# Patient Record
Sex: Female | Born: 2000 | Race: Black or African American | Hispanic: No | Smoking: Current some day smoker
Health system: Southern US, Community
[De-identification: ages and names within clinical notes are randomized; demographics above are authoritative.]

## PROBLEM LIST (undated history)

## (undated) HISTORY — PX: OTHER SURGICAL HISTORY: SHX169

---
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
## 2020-07-18 IMAGING — CR CHEST - 2 VIEW
1 series · 2 of 2 positions shown · non-contrast
Comparison: None.

CLINICAL DATA: Chest pain.

EXAM:
CHEST - 2 VIEW

[Series 1: dg chest 2 view · 0.14mm/px · 2 of 2 slices shown]
[im 1/2]
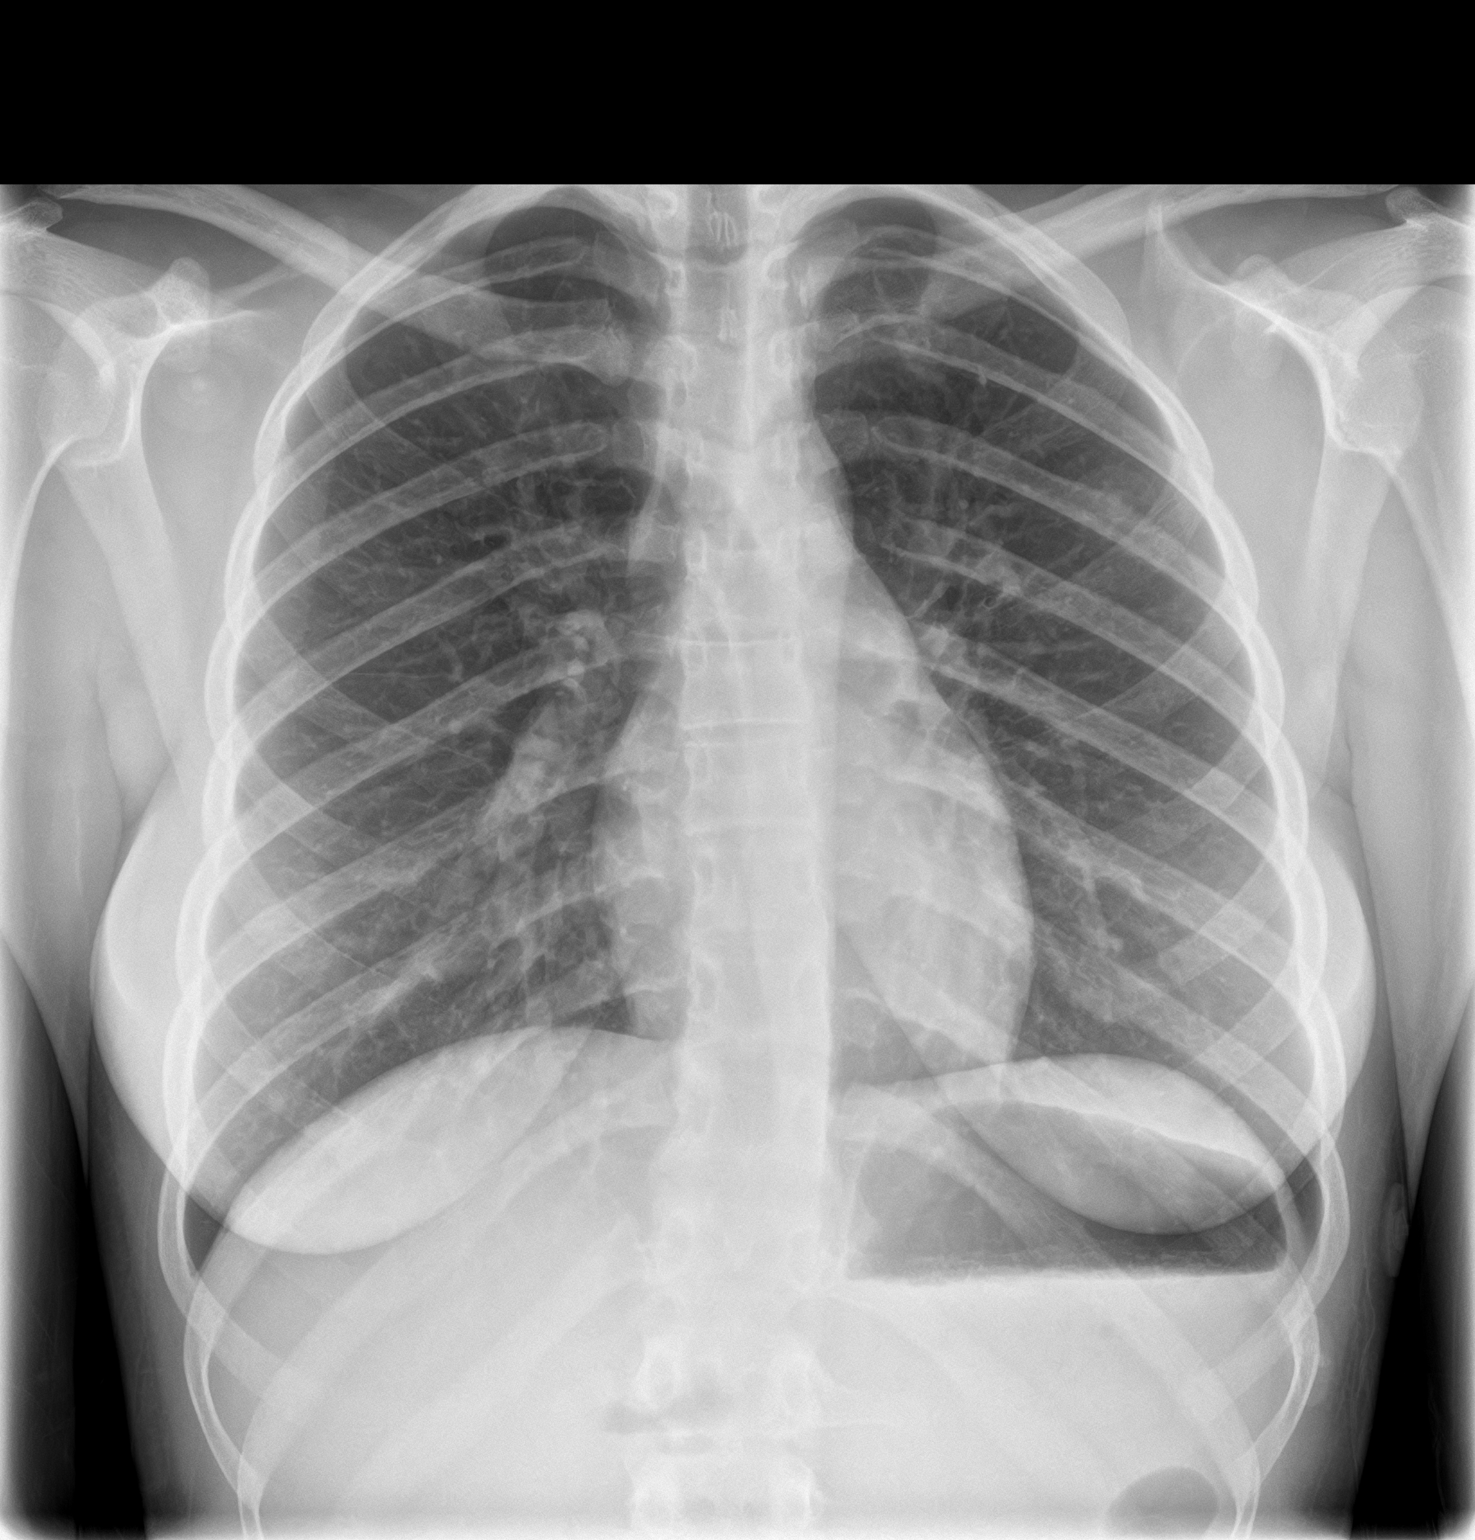
[im 2/2]
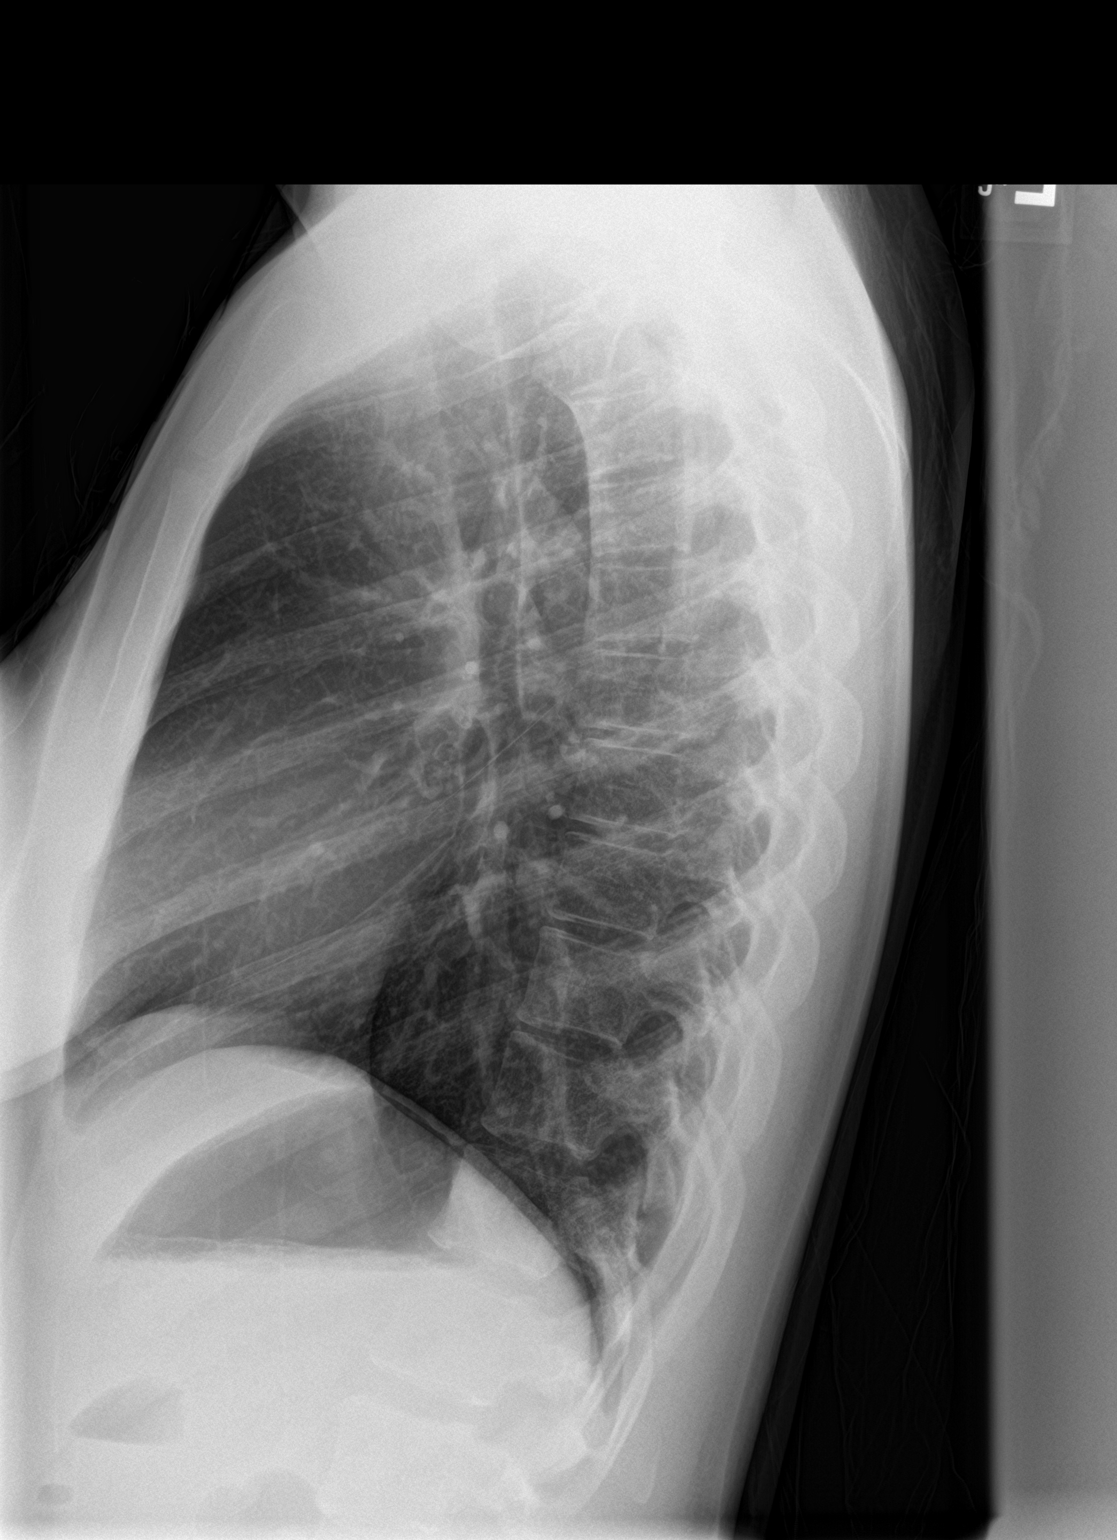

[2 of 2 positions shown; findings below may reference images not displayed]

FINDINGS: The cardiomediastinal contours are normal. The lungs are clear.
Pulmonary vasculature is normal. No consolidation, pleural effusion,
or pneumothorax. No acute osseous abnormalities are seen.
IMPRESSION: Normal radiographs of the chest.

## 2021-04-12 ENCOUNTER — Ambulatory Visit: Payer: Medicaid Other

## 2021-08-28 ENCOUNTER — Ambulatory Visit (LOCAL_COMMUNITY_HEALTH_CENTER): Payer: Medicaid Other

## 2021-08-28 VITALS — BP 112/72 | Ht 63.0 in | Wt 122.0 lb

## 2021-08-28 DIAGNOSIS — Z3201 Encounter for pregnancy test, result positive: Secondary | ICD-10-CM

## 2021-08-28 MED ORDER — PRENATAL 27-0.8 MG PO TABS: 1.0000 | ORAL_TABLET | Freq: Every day | ORAL | 0 refills | Status: AC

## 2021-08-28 NOTE — Progress Notes (Signed)
UPT positive.  Not sure where she wants to go for prenatal care.  Prenatal care resource list given and instructed to establish care as soon as possible.  Pregnancy information packet given and reviewed with pt.    The patient was dispensed prenatal vitamins today. I provided counseling today regarding the medication. We discussed the medication, the side effects and when to call clinic. Patient given the opportunity to ask questions. Questions answered.    Cherlynn Polo, RN

## 2021-08-29 LAB — PREGNANCY, URINE: Preg Test, Ur: POSITIVE — AB

## 2021-09-26 ENCOUNTER — Telehealth: Payer: Self-pay

## 2021-09-26 NOTE — Telephone Encounter (Signed)
Telephone call to patient today to inquire about her plans for prenatal care.  She reports being [redacted] weeks pregnant currently and was going to call [facility] when she was 12 weeks to schedule her first OB appointment.  Dahlia Bailiff, RN

## 2021-10-02 ENCOUNTER — Telehealth: Payer: Self-pay

## 2021-10-02 NOTE — Telephone Encounter (Signed)
Call to client to reschedule new OB appt (EGA = 11 2/7) due to provider availability. Left message to call regarding above and number to call provided. Call to mother (emergency contact) regarding above and she states will contact client. Rich Number, RN

## 2021-10-02 NOTE — Telephone Encounter (Signed)
Call received from client and [facility] IP appt rescheduled for 10/16/2021. Rich Number, RN

## 2021-10-16 ENCOUNTER — Telehealth: Payer: Self-pay

## 2021-10-16 NOTE — Telephone Encounter (Signed)
TC to patient to try to reschedule new OB appointment due to staffing issues. Next available new OB appointment is next Wednesday morning, and 2 appointments available on Thursday 10/26/21. LM with number to call.Jenetta Downer, RN

## 2021-10-17 ENCOUNTER — Ambulatory Visit: Payer: Medicaid Other | Admitting: Advanced Practice Midwife

## 2021-10-17 ENCOUNTER — Telehealth: Payer: Self-pay

## 2021-10-17 ENCOUNTER — Encounter: Payer: Self-pay | Admitting: Advanced Practice Midwife

## 2021-10-17 DIAGNOSIS — Z3401 Encounter for supervision of normal first pregnancy, first trimester: Secondary | ICD-10-CM | POA: Diagnosis not present

## 2021-10-17 DIAGNOSIS — Z3402 Encounter for supervision of normal first pregnancy, second trimester: Secondary | ICD-10-CM | POA: Insufficient documentation

## 2021-10-17 LAB — WET PREP FOR TRICH, YEAST, CLUE
Trichomonas Exam: NEGATIVE
Yeast Exam: NEGATIVE

## 2021-10-17 LAB — HEMOGLOBIN, FINGERSTICK: Hemoglobin: 12.3 g/dL (ref 11.1–15.9)

## 2021-10-17 NOTE — Progress Notes (Signed)
Wet mount reviewed with provider - no treatment indicated due to no symptoms.   Hgb reviewed - no treatment indicated.   Al Decant, RN

## 2021-10-17 NOTE — Progress Notes (Signed)
Patient here for new OB visit at about 13 3/7. FOB in waiting area, [place], would like to come in for heart beat. Patient lives with her mom and her brother.Jenetta Downer, RN

## 2021-10-17 NOTE — Telephone Encounter (Signed)
Call to patient to let her know of [place] appointment on 10/19/21 at 2:30pm. Gave patient address to [facility]. Patient aware to arrive with a full bladder.   Al Decant, RN

## 2021-10-17 NOTE — Progress Notes (Signed)
[place] [facility]  [facility]   INITIAL PRENATAL VISIT NOTE  Subjective:  Briana Robinson is a 21 y.o. SBF exvaper G1P0000 at [redacted]w[redacted]d being seen today to start prenatal care at the [facility] Department. She feels "a little nervous but mostly excited" about surprise pregnancy with no birth control. 21 yo employed FOB feels "same as me" about pregnancy, has no children; in supportive 1 year relationship. She is employed 35-40 hrs/wk at [employer], not in school and living with her mom and 57 yo brother. LMP 07/15/21. Denies u/s or ER use this pregnancy.  Denies cigs, cigars, MJ. Last vaped 05/2021. Last ETOH 05/22/21 (1 Margarita) first time. Last dental exam age 77.  She is currently monitored for the following issues for this low-risk pregnancy and has Encounter for supervision of normal first pregnancy, first trimester on their problem list.  Patient reports no complaints.   .  .   . Denies leaking of fluid.   Indications for ASA therapy (per uptodate) One of the following: Previous pregnancy with preeclampsia, especially early onset and with an adverse outcome No Multifetal gestation No Chronic hypertension No Type 1 or 2 diabetes mellitus No Chronic kidney disease No Autoimmune disease (antiphospholipid syndrome, systemic lupus erythematosus) No  Two or more of the following: Nulliparity Yes Obesity (body mass index >30 kg/m2) No Family history of preeclampsia in mother or sister No Age ?35 years No Sociodemographic characteristics (African American race, low socioeconomic level) Yes Personal risk factors (eg, previous pregnancy with low birth weight or small for gestational age infant, previous adverse pregnancy outcome [eg, stillbirth], interval >10 years between pregnancies) No   The following portions of the patient's history were reviewed and updated as appropriate: allergies, current medications, past family history, past medical history, past social  history, past surgical history and problem list. Problem list updated.  Objective:   Vitals:   10/17/21 1351  BP: 106/67  Pulse: 78  Temp: (!) 97.5 F (36.4 C)  Weight: 126 lb (57.2 kg)    Fetal Status:            Physical Exam Vitals and nursing note reviewed.  Constitutional:      General: She is not in acute distress.    Appearance: Normal appearance. She is well-developed and normal weight.  HENT:     Head: Normocephalic and atraumatic.     Right Ear: External ear normal.     Left Ear: External ear normal.     Nose: Nose normal. No congestion or rhinorrhea.     Mouth/Throat:     Lips: Pink.     Mouth: Mucous membranes are moist.     Dentition: Normal dentition. No dental caries.     Pharynx: Oropharynx is clear. Uvula midline.     Comments: Dentition: fair, last dental exam age 48 Eyes:     General: No scleral icterus.    Conjunctiva/sclera: Conjunctivae normal.  Neck:     Thyroid: No thyroid mass, thyromegaly or thyroid tenderness.  Cardiovascular:     Rate and Rhythm: Normal rate.     Pulses: Normal pulses.     Comments: Extremities are warm and well perfused Pulmonary:     Effort: Pulmonary effort is normal.     Breath sounds: Normal breath sounds.  Chest:     Chest wall: No mass.  Breasts:    Tanner Score is 5.     Breasts are symmetrical.     Right: Normal. No mass,  nipple discharge or skin change.     Left: Normal. No mass, nipple discharge or skin change.  Abdominal:     Palpations: Abdomen is soft.     Tenderness: There is no abdominal tenderness.     Comments: Gravid, soft without masses or tenderness, good tone, FH=12 wks size; FHR=160  Genitourinary:    General: Normal vulva.     Exam position: Lithotomy position.     Pubic Area: No rash.      Labia:        Right: No rash.        Left: No rash.      Vagina: Vaginal discharge (white creamy leukorrhea, ph<4.5) present.     Cervix: Normal.     Uterus: Normal. Enlarged (Gravid 12 wks size).  Not tender.      Adnexa: Right adnexa normal and left adnexa normal.     Rectum: Normal. No external hemorrhoid.     Comments: Pap done Musculoskeletal:     Right lower leg: No edema.     Left lower leg: No edema.  Lymphadenopathy:     Cervical: No cervical adenopathy.     Upper Body:     Right upper body: No axillary adenopathy.     Left upper body: No axillary adenopathy.  Skin:    General: Skin is warm.     Capillary Refill: Capillary refill takes less than 2 seconds.  Neurological:     Mental Status: She is alert.    Assessment and Plan:  Pregnancy: G1P0000 at [redacted]w[redacted]d  1. Encounter for supervision of normal first pregnancy, first trimester Counseled on weight gain of 25-25 lbs this pregnancy Desires NIPS today Dating u/s ordered Please give pt dental list and encourage exam  - Pregnancy, Initial Screen - Hgb Fractionation Cascade - MaterniT21 PLUS Core - [id number] Drug Screen - Pap IG (Image Guided) - WET PREP FOR TRICH, YEAST, CLUE - Hemoglobin, venipuncture - US OB Comp Less 14 Wks; Future    Discussed overview of care and coordination with inpatient delivery practices including [facility], [facility], [facility] and [facility].   Reviewed Centering pregnancy as standard of care at [facility]   Preterm labor symptoms and general obstetric precautions including but not limited to vaginal bleeding, contractions, leaking of fluid and fetal movement were reviewed in detail with the patient.  Please refer to After Visit Summary for other counseling recommendations.   Return in about 4 weeks (around 11/14/2021) for routine PNC.  No future appointments.  Alberteen Spindle, CNM

## 2021-10-18 LAB — 789231 7+OXYCODONE-BUND
Amphetamines, Urine: NEGATIVE ng/mL
BENZODIAZ UR QL: NEGATIVE ng/mL
Barbiturate screen, urine: NEGATIVE ng/mL
Cannabinoid Quant, Ur: NEGATIVE ng/mL
Cocaine (Metab.): NEGATIVE ng/mL
OPIATE SCREEN URINE: NEGATIVE ng/mL
Oxycodone/Oxymorphone, Urine: NEGATIVE ng/mL
PCP Quant, Ur: NEGATIVE ng/mL

## 2021-10-19 ENCOUNTER — Ambulatory Visit
Admission: RE | Admit: 2021-10-19 | Discharge: 2021-10-19 | Disposition: A | Discharge status: Home / Self Care | Payer: Medicaid Other | Source: Ambulatory Visit | Attending: Advanced Practice Midwife | Admitting: Advanced Practice Midwife

## 2021-10-19 DIAGNOSIS — Z369 Encounter for antenatal screening, unspecified: Secondary | ICD-10-CM | POA: Insufficient documentation

## 2021-10-19 DIAGNOSIS — Z3A12 12 weeks gestation of pregnancy: Secondary | ICD-10-CM | POA: Insufficient documentation

## 2021-10-19 DIAGNOSIS — Z3401 Encounter for supervision of normal first pregnancy, first trimester: Secondary | ICD-10-CM | POA: Insufficient documentation

## 2021-10-19 LAB — PAP IG (IMAGE GUIDED): PAP Smear Comment: 0

## 2021-10-22 LAB — PREGNANCY, INITIAL SCREEN
Antibody Screen: NEGATIVE
Basophils Absolute: 0 10*3/uL (ref 0.0–0.2)
Basos: 0 %
Bilirubin, UA: NEGATIVE
Chlamydia trachomatis, NAA: NEGATIVE
EOS (ABSOLUTE): 0.2 10*3/uL (ref 0.0–0.4)
Eos: 3 %
Glucose, UA: NEGATIVE
HCV Ab: NONREACTIVE
HIV Screen 4th Generation wRfx: NONREACTIVE
Hematocrit: 38.3 % (ref 34.0–46.6)
Hemoglobin: 12.7 g/dL (ref 11.1–15.9)
Hepatitis B Surface Ag: NEGATIVE
Immature Grans (Abs): 0 10*3/uL (ref 0.0–0.1)
Immature Granulocytes: 0 %
Ketones, UA: NEGATIVE
Lymphocytes Absolute: 2.1 10*3/uL (ref 0.7–3.1)
Lymphs: 29 %
MCH: 27.7 pg (ref 26.6–33.0)
MCHC: 33.2 g/dL (ref 31.5–35.7)
MCV: 84 fL (ref 79–97)
Monocytes Absolute: 0.4 10*3/uL (ref 0.1–0.9)
Monocytes: 5 %
Neisseria Gonorrhoeae by PCR: NEGATIVE
Neutrophils Absolute: 4.5 10*3/uL (ref 1.4–7.0)
Neutrophils: 63 %
Nitrite, UA: POSITIVE — AB
Platelets: 344 10*3/uL (ref 150–450)
Protein,UA: NEGATIVE
RBC, UA: NEGATIVE
RBC: 4.58 x10E6/uL (ref 3.77–5.28)
RDW: 13.3 % (ref 11.7–15.4)
RPR Ser Ql: NONREACTIVE
Rh Factor: POSITIVE
Rubella Antibodies, IGG: 6.46 index (ref >0.99)
Specific Gravity, UA: 1.025 (ref 1.005–1.030)
Urobilinogen, Ur: 1 mg/dL (ref 0.2–1.0)
WBC: 7.3 10*3/uL (ref 3.4–10.8)
pH, UA: 6.5 (ref 5.0–7.5)

## 2021-10-22 LAB — MATERNIT 21 PLUS CORE, BLOOD
Fetal Fraction: 16
Result (T21): NEGATIVE
Trisomy 13 (Patau syndrome): NEGATIVE
Trisomy 18 (Edwards syndrome): NEGATIVE
Trisomy 21 (Down syndrome): NEGATIVE

## 2021-10-22 LAB — MICROSCOPIC EXAMINATION
Casts: NONE SEEN /lpf
Epithelial Cells (non renal): >10 /hpf — AB (ref 0–10)
RBC, Urine: NONE SEEN /hpf (ref 0–2)
WBC, UA: >30 /hpf — AB (ref 0–5)

## 2021-10-22 LAB — URINE CULTURE, OB REFLEX

## 2021-10-22 LAB — HGB FRACTIONATION CASCADE
Hgb A2: 2.7 % (ref 1.8–3.2)
Hgb A: 97.3 % (ref 96.4–98.8)
Hgb F: 0 % (ref 0.0–2.0)
Hgb S: 0 %

## 2021-10-22 LAB — HCV INTERPRETATION

## 2021-10-24 ENCOUNTER — Other Ambulatory Visit: Payer: Self-pay | Admitting: Advanced Practice Midwife

## 2021-10-24 ENCOUNTER — Telehealth: Payer: Self-pay

## 2021-10-24 ENCOUNTER — Encounter: Payer: Self-pay | Admitting: Advanced Practice Midwife

## 2021-10-24 DIAGNOSIS — Z3401 Encounter for supervision of normal first pregnancy, first trimester: Secondary | ICD-10-CM

## 2021-10-24 DIAGNOSIS — N390 Urinary tract infection, site not specified: Secondary | ICD-10-CM | POA: Insufficient documentation

## 2021-10-24 DIAGNOSIS — O2340 Unspecified infection of urinary tract in pregnancy, unspecified trimester: Secondary | ICD-10-CM

## 2021-10-24 MED ORDER — NITROFURANTOIN MONOHYD MACRO 100 MG PO CAPS: 100.0000 mg | ORAL_CAPSULE | Freq: Two times a day (BID) | ORAL | 0 refills | Status: DC

## 2021-10-24 NOTE — Telephone Encounter (Signed)
TC to patient to inform of UTI and need for treatment. Need patient pharmacy so provider can e-prescribe meds. LM with number to call.Jenetta Downer, RN

## 2021-10-24 NOTE — Telephone Encounter (Signed)
Patient called back and patient pharmacy preference added to patient demographics. Patient counseled she needs to pick up medication for UTI. Provider aware pharmacy added and can e-prescribe now, and patient counseled to call [facility] tonight or tomorrow morning before she goes to pick up medication. Jenetta Downer, RN

## 2021-11-09 NOTE — Addendum Note (Signed)
Addended by: Cletis Media on: 11/09/2021 11:53 AM   Modules accepted: Orders

## 2021-11-14 ENCOUNTER — Ambulatory Visit: Payer: Medicaid Other | Admitting: Advanced Practice Midwife

## 2021-11-14 ENCOUNTER — Ambulatory Visit: Payer: Medicaid Other

## 2021-11-14 VITALS — BP 101/61 | HR 86 | Temp 98.7°F | Wt 126.0 lb

## 2021-11-14 DIAGNOSIS — O2342 Unspecified infection of urinary tract in pregnancy, second trimester: Secondary | ICD-10-CM

## 2021-11-14 DIAGNOSIS — Z3401 Encounter for supervision of normal first pregnancy, first trimester: Secondary | ICD-10-CM

## 2021-11-14 DIAGNOSIS — O2340 Unspecified infection of urinary tract in pregnancy, unspecified trimester: Secondary | ICD-10-CM

## 2021-11-14 DIAGNOSIS — Z3402 Encounter for supervision of normal first pregnancy, second trimester: Secondary | ICD-10-CM

## 2021-11-14 NOTE — Addendum Note (Signed)
Addended by: Donnal Moat on: 11/14/2021 02:48 PM   Modules accepted: Orders

## 2021-11-14 NOTE — Progress Notes (Signed)
Per client, received Nitrofurantoin in the mail from [place] and has 2 pills left to take. States not completed because slept all day x1 and did not take medicine. AFP only today. Rich Number, RN [place] referral for anatomy scan faxed to [facility] with snapshot pages. Fax confirmation received. Rich Number, RN

## 2021-11-14 NOTE — Progress Notes (Signed)
[facility]  PRENATAL VISIT NOTE  Subjective:  Briana Robinson is a 21 y.o. G1P0000 at [redacted]w[redacted]d being seen today for ongoing prenatal care.  She is currently monitored for the following issues for this low-risk pregnancy and has Encounter for supervision of normal first pregnancy, first trimester and UTI (urinary tract infection) during pregnancy 10/17/21 >100,000 Klebsiella pneumoniae on their problem list.  Patient reports no complaints.   .  .  Movement: Absent. Denies leaking of fluid/ROM.   The following portions of the patient's history were reviewed and updated as appropriate: allergies, current medications, past family history, past medical history, past social history, past surgical history and problem list. Problem list updated.  Objective:   Vitals:   11/14/21 1044  BP: 101/61  Pulse: 86  Temp: 98.7 F (37.1 C)  Weight: 126 lb (57.2 kg)    Fetal Status:     Movement: Absent     General:  Alert, oriented and cooperative. Patient is in no acute distress.  Skin: Skin is warm and dry. No rash noted.   Cardiovascular: Normal heart rate noted  Respiratory: Normal respiratory effort, no problems with respiration noted  Abdomen: Soft, gravid, appropriate for gestational age.        Pelvic: Cervical exam deferred        Extremities: Normal range of motion.  Edema: None  Mental Status: Normal mood and affect. Normal behavior. Normal judgment and thought content.   Assessment and Plan:  Pregnancy: G1P0000 at [redacted]w[redacted]d  1. Encounter for supervision of normal first pregnancy in second trimester Here with FOB AFP only today Working 35 hrs/wk Anatomy u/s ordered Walking 3x/wk x 1 hour Living with her mom Reviewed 10/19/21 u/s at 12 2/7  - AFP, Serum, Open Spina Bifida   3. Urinary tract infection in mother during pregnancy, antepartum dx'd 10/17/21 Pt states she received macrobid in the mail from [place] on 10/30/21 and began taking on  10/31/21 but was noncompliant and forgot to take at least 1 day C&S TOC today  - Urine Culture & Sensitivity   Preterm labor symptoms and general obstetric precautions including but not limited to vaginal bleeding, contractions, leaking of fluid and fetal movement were reviewed in detail with the patient. Please refer to After Visit Summary for other counseling recommendations.  Return in about 4 weeks (around 12/12/2021) for routine PNC.  No future appointments.  Herbie Saxon, CNM

## 2021-11-16 ENCOUNTER — Telehealth: Payer: Self-pay

## 2021-11-16 LAB — AFP, SERUM, OPEN SPINA BIFIDA
AFP MoM: 2.4
AFP Value: 100.5 ng/mL
Gest. Age on Collection Date: 16 weeks
Maternal Age At EDD: 21.9 yr
OSBR Risk 1 IN: 698
Test Results:: NEGATIVE
Weight: 126 [lb_av]

## 2021-11-16 NOTE — Telephone Encounter (Signed)
Call to [facility] to ascertain if anatomy US (referral faxed 11/14/21) has been scheduled. Appt is still pending per receptionist. Jossie Ng, RN

## 2021-11-17 LAB — URINE CULTURE

## 2021-11-20 ENCOUNTER — Other Ambulatory Visit: Payer: Self-pay | Admitting: Advanced Practice Midwife

## 2021-11-20 ENCOUNTER — Telehealth: Payer: Self-pay

## 2021-11-20 ENCOUNTER — Encounter: Payer: Self-pay | Admitting: Advanced Practice Midwife

## 2021-11-20 DIAGNOSIS — O2340 Unspecified infection of urinary tract in pregnancy, unspecified trimester: Secondary | ICD-10-CM

## 2021-11-20 MED ORDER — AMOXICILLIN-POT CLAVULANATE 875-125 MG PO TABS: 1.0000 | ORAL_TABLET | Freq: Two times a day (BID) | ORAL | 0 refills | Status: DC

## 2021-11-20 NOTE — Telephone Encounter (Signed)
Client has UTI (TOC positive) and needs treatment. Prescription has been e-prescribed by Hazle Coca CNM and client may either pick up prescription or come to [facility] for Ceftriaxone if does not want to take oral medication.  Call to client and per recorded message, not accepting calls at this time. Call to mother (emergency contact) and requested assistance contacting client to call [facility] regarding above. Number to call provided and mother states will try to contact her. Jossie Ng, RN

## 2021-11-20 NOTE — Telephone Encounter (Signed)
Return call from client and transferred to [facility] by Denzil Hughes, [facility] intake clerk. Per client, she would prefer to come to clinic for injection and appt scheduled for tomorrow with arrival time of 1600 (time does not interfere with her job).  Hazle Coca CNM notified of client's decision regarding treatment. Jossie Ng, RN

## 2021-11-21 ENCOUNTER — Ambulatory Visit: Payer: Medicaid Other | Admitting: Advanced Practice Midwife

## 2021-11-21 VITALS — BP 124/75 | HR 89 | Temp 97.4°F | Wt 128.4 lb

## 2021-11-21 DIAGNOSIS — O2340 Unspecified infection of urinary tract in pregnancy, unspecified trimester: Secondary | ICD-10-CM

## 2021-11-21 DIAGNOSIS — Z3402 Encounter for supervision of normal first pregnancy, second trimester: Secondary | ICD-10-CM

## 2021-11-21 DIAGNOSIS — Z3401 Encounter for supervision of normal first pregnancy, first trimester: Secondary | ICD-10-CM

## 2021-11-21 DIAGNOSIS — O2342 Unspecified infection of urinary tract in pregnancy, second trimester: Secondary | ICD-10-CM

## 2021-11-21 MED ORDER — CEFTRIAXONE SODIUM 1 G IJ SOLR: 1.0000 g | Freq: Once | INTRAMUSCULAR | 0 refills | Status: DC

## 2021-11-21 MED ORDER — CEFTRIAXONE SODIUM 500 MG IJ SOLR
1000.0000 mg | Freq: Once | INTRAMUSCULAR | Status: AC
  Administered 2021-11-21: 1000 mg via INTRAMUSCULAR

## 2021-11-21 NOTE — Progress Notes (Signed)
[facility]  PRENATAL VISIT NOTE  Subjective:  Briana Robinson is a 21 y.o. G1P0000 at [redacted]w[redacted]d being seen today for ongoing prenatal care.  She is currently monitored for the following issues for this low-risk pregnancy and has Encounter for supervision of normal first pregnancy, first trimester; UTI (urinary tract infection); and Urinary tract infection #2 11/14/21 Klebsiella pneumoniae on their problem list.  Patient reports no complaints.   .  .   . Denies leaking of fluid/ROM.   The following portions of the patient's history were reviewed and updated as appropriate: allergies, current medications, past family history, past medical history, past social history, past surgical history and problem list. Problem list updated.  Objective:   Vitals:   11/21/21 1538  BP: 124/75  Pulse: 89  Temp: (!) 97.4 F (36.3 C)  Weight: 128 lb 6.4 oz (58.2 kg)    Fetal Status:           General:  Alert, oriented and cooperative. Patient is in no acute distress.  Skin: Skin is warm and dry. No rash noted.   Cardiovascular: Normal heart rate noted  Respiratory: Normal respiratory effort, no problems with respiration noted  Abdomen: Soft, gravid, appropriate for gestational age.        Pelvic: Cervical exam deferred        Extremities: Normal range of motion.     Mental Status: Normal mood and affect. Normal behavior. Normal judgment and thought content.   Assessment and Plan:  Pregnancy: G1P0000 at [redacted]w[redacted]d  1. Urinary tract infection #2 11/14/21 Klebsiella pneumoniae Did not clear first UTI because was noncompliant with tx Drinking coffee 1x/wk and coke 12 oz 1x/wk--counseled to stop Pt chooses to have Ceftriaxone IM 1 gm today instead of Augmentin po BID x 10 days Will need TOC after tx completed -CVAT   2. Encounter for supervision of normal first pregnancy, first trimester Next apt here 12/12/21 Here with her mom    Preterm labor symptoms and  general obstetric precautions including but not limited to vaginal bleeding, contractions, leaking of fluid and fetal movement were reviewed in detail with the patient. Please refer to After Visit Summary for other counseling recommendations.  Return in about 3 weeks (around 12/12/2021) for routine PNC.  Future Appointments  Date Time Provider Department Center  12/12/2021  4:00 PM [facility] PROVIDER [facility] None    Alberteen Spindle, CNM

## 2021-11-21 NOTE — Progress Notes (Signed)
Macrobid missed doses for several days. Did not take Augmentin. Here for UTI IM Injection. Tolerated IM injection. BThiele RN

## 2021-11-22 ENCOUNTER — Telehealth: Payer: Self-pay

## 2021-11-22 NOTE — Telephone Encounter (Signed)
Call to [facility] to ascertain if anatomy US appt scheduled. Per Trula Ore, they have not been able to contact her via phone call. Another phone number provided for client and Trula Ore states they will use that number and call client again. Jossie Ng, RN

## 2021-11-23 ENCOUNTER — Telehealth: Payer: Self-pay

## 2021-11-23 NOTE — Telephone Encounter (Signed)
Call to client and requested she contact [facility] (number provided) to obtain appt for anatomy US. Referral faxed on 11/14/2021 and again on 11/21/2021 with fax confirmation received each time. Jossie Ng, RN

## 2021-11-27 ENCOUNTER — Telehealth: Payer: Self-pay

## 2021-11-27 NOTE — Telephone Encounter (Signed)
Call to [facility] to ascertain if anatomy US appt scheduled. Per Lawanna Kobus, a voicemail was left on client's phone 11/22/2021 requesting she call them to schedule [place] appt. Thus far, [facility] has not had a response from client. This RN called client at home phone number and per recorded message, not a working number. Call to client's cell phone and left message requesting she return [facility]'s call from 11/22/2021 and schedule [place] appt. [facility]. [facility] phone number left on message. Jossie Ng, RN

## 2021-11-29 ENCOUNTER — Telehealth: Payer: Self-pay

## 2021-11-29 NOTE — Telephone Encounter (Signed)
TC to patient home number to give her phone number and counsel to call [facility] to schedule anatomy U/S. [facility] has been unsuccessful in attempts to reach patient. Unable to LM on home phone number. TC to cell number and left message with [facility] number to call for U/S appointment. Burt Knack, RN

## 2021-12-04 ENCOUNTER — Telehealth: Payer: Self-pay | Admitting: Family Medicine

## 2021-12-04 NOTE — Telephone Encounter (Signed)
Returned call to patient who wants to know about her U/S appointment. [facility] has not been able to reach patient, so patient given phone number for [facility] and counseled to call for appointment tomorrow between 8am -5pm. Patient states understanding.Burt Knack, RN

## 2021-12-05 NOTE — Telephone Encounter (Signed)
Per [facility], client called this am and anatomy US appt scheduled for 12/21/2021 at 3:45 pm. Jossie Ng, RN

## 2021-12-05 NOTE — Telephone Encounter (Signed)
Call to [facility] and client has not yet scheduled anatomy US appt. Call to client who states someone called her yesterday and gave her the number to call for [place]. Client states she plans to call today to schedule appt. Jossie Ng, RN

## 2021-12-12 ENCOUNTER — Ambulatory Visit: Payer: Medicaid Other | Admitting: Family Medicine

## 2021-12-12 VITALS — BP 108/66 | HR 75 | Temp 97.5°F | Wt 133.6 lb

## 2021-12-12 DIAGNOSIS — O2342 Unspecified infection of urinary tract in pregnancy, second trimester: Secondary | ICD-10-CM

## 2021-12-12 DIAGNOSIS — Z3402 Encounter for supervision of normal first pregnancy, second trimester: Secondary | ICD-10-CM

## 2021-12-12 NOTE — Progress Notes (Signed)
[facility]  PRENATAL VISIT NOTE  Subjective:  Briana Robinson is a 21 y.o. G1P0000 at [redacted]w[redacted]d being seen today for ongoing prenatal care.  She is currently monitored for the following issues for this low-risk pregnancy and has Encounter for supervision of normal first pregnancy in second trimester; UTI (urinary tract infection); and Urinary tract infection #2 11/14/21 Klebsiella pneumoniae on their problem list.  Patient reports no complaints.  Contractions: Not present. Vag. Bleeding: None.  Movement: Present. Denies leaking of fluid/ROM.   The following portions of the patient's history were reviewed and updated as appropriate: allergies, current medications, past family history, past medical history, past social history, past surgical history and problem list. Problem list updated.  Objective:   Vitals:   12/12/21 1554  BP: 108/66  Pulse: 75  Temp: (!) 97.5 F (36.4 C)  Weight: 133 lb 9.6 oz (60.6 kg)    Fetal Status: Fetal Heart Rate (bpm): 169 Fundal Height: 20 cm Movement: Present  Presentation: Undeterminable  General:  Alert, oriented and cooperative. Patient is in no acute distress.  Skin: Skin is warm and dry. No rash noted.   Cardiovascular: Normal heart rate noted  Respiratory: Normal respiratory effort, no problems with respiration noted  Abdomen: Soft, gravid, appropriate for gestational age.  Pain/Pressure: Absent     Pelvic: Cervical exam deferred        Extremities: Normal range of motion.  Edema: None  Mental Status: Normal mood and affect. Normal behavior. Normal judgment and thought content.   Assessment and Plan:  Pregnancy: G1P0000 at [redacted]w[redacted]d  1. Encounter for supervision of normal first pregnancy in second trimester -Had previous early U/S (<14wks) 10/12 -Anatomy U/S scheduled 12/14 -Discussed with patient breast changes- having small amounts of discharge from breast- likely early milk production- counseled on  normality   2. Urinary tract infection in mother during second trimester of pregnancy Second UTI this pregnancy- tx with Ceftriaxone- TOC today (12/5)  - Urine Culture & Sensitivity'   Preterm labor symptoms and general obstetric precautions including but not limited to vaginal bleeding, contractions, leaking of fluid and fetal movement were reviewed in detail with the patient.  Please refer to After Visit Summary for other counseling recommendations.   F/u in 4 weeks for routine prenatal care.   Lenice Llamas, [place]

## 2021-12-14 LAB — URINE CULTURE

## 2021-12-21 ENCOUNTER — Telehealth: Payer: Self-pay | Admitting: Family Medicine

## 2021-12-21 NOTE — Telephone Encounter (Signed)
Pt states that Lanora Manis told her at her last appointment that she was scheduling an ultrasound appointment for her for 12/21/2021. [facility] and states that they do not have her scheduled. She needs to know if the appointment was definitely scheduled and where it was scheduled

## 2021-12-21 NOTE — Telephone Encounter (Signed)
Call to client who states realized [place] appt was scheduled at [facility] and she states she is at [facility] now. Jossie Ng, RN

## 2021-12-25 ENCOUNTER — Encounter: Payer: Self-pay | Admitting: Advanced Practice Midwife

## 2022-01-08 NOTE — L&D Delivery Note (Signed)
     Delivery Note   Briana Robinson is a 22 y.o. G1P0000 at [redacted]w[redacted]d Estimated Date of Delivery: 05/01/22  PRE-OPERATIVE DIAGNOSIS:  1) [redacted]w[redacted]d pregnancy.    POST-OPERATIVE DIAGNOSIS:  1) [redacted]w[redacted]d pregnancy s/p Vaginal, Spontaneous    Delivery Type: Vaginal, Spontaneous    Delivery Anesthesia: Epidural   Labor Complications: variable and late decelerations, meconium stained amniotic fluid.     ESTIMATED BLOOD LOSS: 100 ml    FINDINGS:   1) female infant, Apgar scores of 8   at 1 minute and 8   at 5 minutes and a birthweight pending , infant skin to skin.  2) Nuchal cord: no  SPECIMENS:   PLACENTA:   Appearance: Intact , 3 vessel cord, cord blood collected   Removal: Spontaneous      Disposition:  intact , per protocol   DISPOSITION:  Infant to left in stable condition in the delivery room, with L&D personnel and mother,  NARRATIVE SUMMARY: Labor course:  Ms. Briana Robinson is a G1P0000 at [redacted]w[redacted]d who presented for labor management.  She progressed well in labor with pitocin.  She received the appropriate anesthesia and proceeded to complete dilation. She evidenced good maternal expulsive effort during the second stage. She went on to deliver a viable female infant "Giana". The placenta delivered without problems and was noted to be complete. A perineal and vaginal examination was performed. Episiotomy/Lacerations: None . The patient tolerated this well.  Doreene Burke, CNM  05/05/2022 2:54 PM

## 2022-01-09 ENCOUNTER — Ambulatory Visit: Payer: Medicaid Other | Admitting: Advanced Practice Midwife

## 2022-01-09 VITALS — BP 110/65 | HR 83 | Temp 97.5°F | Wt 134.8 lb

## 2022-01-09 DIAGNOSIS — O2340 Unspecified infection of urinary tract in pregnancy, unspecified trimester: Secondary | ICD-10-CM

## 2022-01-09 DIAGNOSIS — O2342 Unspecified infection of urinary tract in pregnancy, second trimester: Secondary | ICD-10-CM

## 2022-01-09 DIAGNOSIS — Z3402 Encounter for supervision of normal first pregnancy, second trimester: Secondary | ICD-10-CM

## 2022-01-09 NOTE — Progress Notes (Signed)
[facility]  PRENATAL VISIT NOTE  Subjective:  Briana Robinson is a 22 y.o. G1P0000 at [redacted]w[redacted]d being seen today for ongoing prenatal care.  She is currently monitored for the following issues for this low-risk pregnancy and has Encounter for supervision of normal first pregnancy in second trimester; UTI (urinary tract infection); and Urinary tract infection #2 11/14/21 Klebsiella pneumoniae on their problem list.  Patient reports no complaints.  Contractions: Not present. Vag. Bleeding: None.  Movement: Present. Denies leaking of fluid/ROM.   The following portions of the patient's history were reviewed and updated as appropriate: allergies, current medications, past family history, past medical history, past social history, past surgical history and problem list. Problem list updated.  Objective:   Vitals:   01/09/22 1606  BP: 110/65  Pulse: 83  Temp: (!) 97.5 F (36.4 C)  Weight: 134 lb 12.8 oz (61.1 kg)    Fetal Status: Fetal Heart Rate (bpm): 140 Fundal Height: 25 cm Movement: Present     General:  Alert, oriented and cooperative. Patient is in no acute distress.  Skin: Skin is warm and dry. No rash noted.   Cardiovascular: Normal heart rate noted  Respiratory: Normal respiratory effort, no problems with respiration noted  Abdomen: Soft, gravid, appropriate for gestational age.  Pain/Pressure: Absent     Pelvic: Cervical exam deferred        Extremities: Normal range of motion.  Edema: None  Mental Status: Normal mood and affect. Normal behavior. Normal judgment and thought content.   Assessment and Plan:  Pregnancy: G1P0000 at [redacted]w[redacted]d  1. Encounter for supervision of normal first pregnancy in second trimester Here with FOB -3.2 oz (-0.091 kg) Gained 1 lb in 4 wks--counseled to eat high protein snacks q 2 hours Hasn't made dental exam yet--given dental list and encouraged Working 25-30 hrs/wk Walking 2x/wk x 30 min Reviewed 12/21/21  u/s at 21 3/7 with AFI wnl, anatomy wnl, no previa, fundal placenta  2. Urinary tract infection #2 11/14/21 Klebsiella pneumoniae C&S TOC on 12/12/21=neg after Ceftriaxone IM on 11/21/21   Preterm labor symptoms and general obstetric precautions including but not limited to vaginal bleeding, contractions, leaking of fluid and fetal movement were reviewed in detail with the patient. Please refer to After Visit Summary for other counseling recommendations.  No follow-ups on file.  No future appointments.  Herbie Saxon, CNM

## 2022-01-09 NOTE — Progress Notes (Signed)
Pregnancy Care Home forms completed and in outguide for provider review. Kept 12/21/2021 [facility] [place] appt. Rich Number, RN

## 2022-02-06 ENCOUNTER — Ambulatory Visit: Payer: Medicaid Other | Admitting: Advanced Practice Midwife

## 2022-02-06 VITALS — BP 115/70 | HR 93 | Temp 96.8°F | Wt 138.4 lb

## 2022-02-06 DIAGNOSIS — Z3402 Encounter for supervision of normal first pregnancy, second trimester: Secondary | ICD-10-CM

## 2022-02-06 DIAGNOSIS — Z3403 Encounter for supervision of normal first pregnancy, third trimester: Secondary | ICD-10-CM

## 2022-02-06 LAB — HEMOGLOBIN, FINGERSTICK: Hemoglobin: 11.3 g/dL (ref 11.1–15.9)

## 2022-02-06 NOTE — Progress Notes (Signed)
[facility]  PRENATAL VISIT NOTE  Subjective:  Briana Robinson is a 22 y.o. G1P0000 at [redacted]w[redacted]d being seen today for ongoing prenatal care.  She is currently monitored for the following issues for this low-risk pregnancy and has Encounter for supervision of normal first pregnancy in second trimester; UTI (urinary tract infection) dx'd 10/17/21; and Urinary tract infection #2 11/14/21 Klebsiella pneumoniae on their problem list.  Patient reports no complaints.  Contractions: Not present. Vag. Bleeding: None.  Movement: Present. Denies leaking of fluid/ROM.   The following portions of the patient's history were reviewed and updated as appropriate: allergies, current medications, past family history, past medical history, past social history, past surgical history and problem list. Problem list updated.  Objective:   Vitals:   02/06/22 1507  BP: 115/70  Pulse: 93  Temp: (!) 96.8 F (36 C)  Weight: 138 lb 6.4 oz (62.8 kg)    Fetal Status: Fetal Heart Rate (bpm): 145 Fundal Height: 27 cm Movement: Present     General:  Alert, oriented and cooperative. Patient is in no acute distress.  Skin: Skin is warm and dry. No rash noted.   Cardiovascular: Normal heart rate noted  Respiratory: Normal respiratory effort, no problems with respiration noted  Abdomen: Soft, gravid, appropriate for gestational age.  Pain/Pressure: Absent     Pelvic: Cervical exam deferred        Extremities: Normal range of motion.  Edema: None  Mental Status: Normal mood and affect. Normal behavior. Normal judgment and thought content.   Assessment and Plan:  Pregnancy: G1P0000 at [redacted]w[redacted]d  1. Encounter for supervision of normal first pregnancy in second trimester Here with mom Working 18-20 hrs/wk Walking on treadmill 3x/wk x 30-45 min Feels well 3 lb 6.4 oz (1.542 kg)  - Hemoglobin, venipuncture - HIV-1/HIV-2 Qualitative RNA - RPR - Glucose, 1 hour  gestational   Preterm labor symptoms and general obstetric precautions including but not limited to vaginal bleeding, contractions, leaking of fluid and fetal movement were reviewed in detail with the patient. Please refer to After Visit Summary for other counseling recommendations.  Return in about 2 weeks (around 02/20/2022) for routine PNC.  No future appointments.  Herbie Saxon, CNM

## 2022-02-06 NOTE — Progress Notes (Signed)
28 week labs done today, Pt wants to do Tdap next visit.  Hgb: 11.3, no treatment indicated.

## 2022-02-08 LAB — GLUCOSE, 1 HOUR GESTATIONAL: Gestational Diabetes Screen: 123 mg/dL (ref 70–139)

## 2022-02-08 LAB — HIV-1/HIV-2 QUALITATIVE RNA
HIV-1 RNA, Qualitative: NONREACTIVE
HIV-2 RNA, Qualitative: NONREACTIVE

## 2022-02-08 LAB — RPR: RPR Ser Ql: NONREACTIVE

## 2022-02-20 ENCOUNTER — Ambulatory Visit: Payer: Medicaid Other | Admitting: Advanced Practice Midwife

## 2022-02-20 VITALS — BP 114/69 | HR 90 | Temp 97.6°F | Wt 141.2 lb

## 2022-02-20 DIAGNOSIS — Z3402 Encounter for supervision of normal first pregnancy, second trimester: Secondary | ICD-10-CM

## 2022-02-20 NOTE — Progress Notes (Signed)
[facility]  PRENATAL VISIT NOTE  Subjective:  Briana Robinson is a 22 y.o. G1P0000 at 48w0dbeing seen today for ongoing prenatal care.  She is currently monitored for the following issues for this low-risk pregnancy and has Encounter for supervision of normal first pregnancy in second trimester; UTI (urinary tract infection) dx'd 10/17/21; and Urinary tract infection #2 11/14/21 Klebsiella pneumoniae on their problem list.  Patient reports no complaints.  Contractions: Not present. Vag. Bleeding: None.  Movement: Present. Denies leaking of fluid/ROM.  The following portions of the patient's history were reviewed and updated as appropriate: allergies, current medications, past family history, past medical history, past social history, past surgical history and problem list. Problem list updated.  Objective:   Vitals:   02/20/22 1410  BP: 114/69  Pulse: 90  Temp: 97.6 F (36.4 C)  Weight: 141 lb 3.2 oz (64 kg)    Fetal Status: Fetal Heart Rate (bpm): 148 Fundal Height: 30 cm Movement: Present     General:  Alert, oriented and cooperative. Patient is in no acute distress.  Skin: Skin is warm and dry. No rash noted.   Cardiovascular: Normal heart rate noted  Respiratory: Normal respiratory effort, no problems with respiration noted  Abdomen: Soft, gravid, appropriate for gestational age.  Pain/Pressure: Absent     Pelvic: Cervical exam deferred        Extremities: Normal range of motion.  Edema: None  Mental Status: Normal mood and affect. Normal behavior. Normal judgment and thought content.   Assessment and Plan:  Pregnancy: G1P0000 at 380w0d1. 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 Please refer to After Visit Summary for other counseling recommendations.  Return in about 2 weeks (around 03/06/2022) for routine PNC.  No future appointments.  ElHerbie SaxonCNM

## 2022-03-06 ENCOUNTER — Ambulatory Visit: Payer: Medicaid Other | Admitting: Family

## 2022-03-06 VITALS — BP 111/71 | HR 93 | Temp 98.6°F | Wt 143.0 lb

## 2022-03-06 DIAGNOSIS — Z3402 Encounter for supervision of normal first pregnancy, second trimester: Secondary | ICD-10-CM

## 2022-03-11 NOTE — Progress Notes (Signed)
[facility]  PRENATAL VISIT NOTE  Subjective:  Briana Robinson is a 22 y.o. G1P0000 at 86w5dbeing seen today for ongoing prenatal care.  She is currently monitored for the following issues for this low-risk pregnancy and has Encounter for supervision of normal first pregnancy in second trimester; UTI (urinary tract infection) dx'd 10/17/21; and Urinary tract infection #2 11/14/21 Klebsiella pneumoniae on their problem list.  Patient reports no complaints.  Contractions: Not present. Vag. Bleeding: None.  Movement: Present. Denies leaking of fluid/ROM.   The following portions of the patient's history were reviewed and updated as appropriate: allergies, current medications, past family history, past medical history, past social history, past surgical history and problem list. Problem list updated.  Objective:   Vitals:   03/06/22 1539  BP: 111/71  Pulse: 93  Temp: 98.6 F (37 C)  Weight: 143 lb (64.9 kg)    Fetal Status: Fetal Heart Rate (bpm): 150 Fundal Height: 33 cm Movement: Present     General:  Alert, oriented and cooperative. Patient is in no acute distress.  Skin: Skin is warm and dry. No rash noted.   Cardiovascular: Normal heart rate noted  Respiratory: Normal respiratory effort, no problems with respiration noted  Abdomen: Soft, gravid, appropriate for gestational age.  Pain/Pressure: Absent     Pelvic: Cervical exam deferred        Extremities: Normal range of motion.  Edema: None  Mental Status: Normal mood and affect. Normal behavior. Normal judgment and thought content.   Assessment and Plan:  Pregnancy: G1P0000 at 345w5d1. Encounter for supervision of normal first pregnancy in second trimester Continue PNVs as prescribed Well-balanced diet- attests to good appetite Continue adequate hydration- drinks 4 water bottles per hour Walking 6097mday x 3d/week- continue as tolerated Currently not working, support by  mother  Preterm labor symptoms and general obstetric precautions including but not limited to vaginal bleeding, contractions, leaking of fluid and fetal movement were reviewed in detail with the patient. Please refer to After Visit Summary for other counseling recommendations.  Return in about 2 weeks (around 03/20/2022) for routine prenatal care.  Future Appointments  Date Time Provider DepColton/12/2022  3:30 PM [facility] PROVIDER [facility] None    KarMarline BackboneNP

## 2022-03-20 ENCOUNTER — Ambulatory Visit: Payer: Medicaid Other | Admitting: Advanced Practice Midwife

## 2022-03-20 VITALS — BP 115/70 | HR 89 | Temp 97.5°F | Wt 141.8 lb

## 2022-03-20 DIAGNOSIS — Z3402 Encounter for supervision of normal first pregnancy, second trimester: Secondary | ICD-10-CM

## 2022-03-20 NOTE — Progress Notes (Signed)
Declines TDAP, patient states is performing kick counts each day. BThiele RN

## 2022-03-20 NOTE — Progress Notes (Signed)
[facility]  PRENATAL VISIT NOTE  Subjective:  Briana Robinson is a 22 y.o. G1P0000 at 26w0dbeing seen today for ongoing prenatal care.  She is currently monitored for the following issues for this low-risk pregnancy and has Encounter for supervision of normal first pregnancy in second trimester; UTI (urinary tract infection) dx'd 10/17/21; and Urinary tract infection #2 11/14/21 Klebsiella pneumoniae on their problem list.  Patient reports no complaints.  Contractions: Not present. Vag. Bleeding: None.  Movement: Present. Denies leaking of fluid/ROM.   The following portions of the patient's history were reviewed and updated as appropriate: allergies, current medications, past family history, past medical history, past social history, past surgical history and problem list. Problem list updated.  Objective:   Vitals:   03/20/22 1544  BP: 115/70  Pulse: 89  Temp: (!) 97.5 F (36.4 C)  Weight: 141 lb 12.8 oz (64.3 kg)    Fetal Status: Fetal Heart Rate (bpm): 130 Fundal Height: 33 cm Movement: Present  Presentation: Vertex  General:  Alert, oriented and cooperative. Patient is in no acute distress.  Skin: Skin is warm and dry. No rash noted.   Cardiovascular: Normal heart rate noted  Respiratory: Normal respiratory effort, no problems with respiration noted  Abdomen: Soft, gravid, appropriate for gestational age.  Pain/Pressure: Absent     Pelvic: Cervical exam deferred        Extremities: Normal range of motion.  Edema: None  Mental Status: Normal mood and affect. Normal behavior. Normal judgment and thought content.   Assessment and Plan:  Pregnancy: G1P0000 at 366w0d1. Encounter for supervision of normal first pregnancy in second trimester 6 lb 12.8 oz (3.084 kg) 2 lb wt loss in last 2 wks [employer] potato chips and nutella Dentist apt 03/26/22 Has crib and car seat; baby shower 03/24/22 Here with mom Not working Walking 3x/wk  x 20 min   Preterm labor symptoms and general obstetric precautions including but not limited to vaginal bleeding, contractions, leaking of fluid and fetal movement were reviewed in detail with the patient. Please refer to After Visit Summary for other counseling recommendations.  Return in about 2 weeks (around 04/03/2022) for routine PNC.  No future appointments.  ElHerbie SaxonCNM

## 2022-04-04 ENCOUNTER — Ambulatory Visit: Payer: Medicaid Other | Admitting: Advanced Practice Midwife

## 2022-04-04 DIAGNOSIS — Z3402 Encounter for supervision of normal first pregnancy, second trimester: Secondary | ICD-10-CM

## 2022-04-04 DIAGNOSIS — Z3403 Encounter for supervision of normal first pregnancy, third trimester: Secondary | ICD-10-CM

## 2022-04-04 NOTE — Progress Notes (Signed)
Pt here for MHRV at 36w 1d. 36 week packet given; PHQ9/Abuse reviewed and documented. 36 week labs done today, pt wants to self collect, instructions given and pt verbalized understanding. Reoffered Tdap this visit, pt declined.   Harland Dingwall, RN

## 2022-04-04 NOTE — Progress Notes (Signed)
[facility]  PRENATAL VISIT NOTE  Subjective:  Briana Robinson is a 22 y.o. G1P0000 at [redacted]w[redacted]d being seen today for ongoing prenatal care.  She is currently monitored for the following issues for this low-risk pregnancy and has Encounter for supervision of normal first pregnancy in second trimester; UTI (urinary tract infection) dx'd 10/17/21; and Urinary tract infection #2 11/14/21 Klebsiella pneumoniae on their problem list.  Patient reports no complaints.  Contractions: Not present. Vag. Bleeding: None.  Movement: Present. Denies leaking of fluid/ROM.   The following portions of the patient's history were reviewed and updated as appropriate: allergies, current medications, past family history, past medical history, past social history, past surgical history and problem list. Problem list updated.  Objective:  There were no vitals filed for this visit.  Fetal Status: Fetal Heart Rate (bpm): 142 Fundal Height: 34 cm Movement: Present  Presentation: Vertex  General:  Alert, oriented and cooperative. Patient is in no acute distress.  Skin: Skin is warm and dry. No rash noted.   Cardiovascular: Normal heart rate noted  Respiratory: Normal respiratory effort, no problems with respiration noted  Abdomen: Soft, gravid, appropriate for gestational age.  Pain/Pressure: Absent     Pelvic: Cervical exam deferred        Extremities: Normal range of motion.  Edema: None  Mental Status: Normal mood and affect. Normal behavior. Normal judgment and thought content.   Assessment and Plan:  Pregnancy: G1P0000 at [redacted]w[redacted]d  1. Encounter for supervision of normal first pregnancy in second trimester 6 lb 12.8 oz (3.084 kg) 2 lb wt loss in last 2 wks Counseled to eat high protein snacks  q 2 hours Self collected GBS/GC/Chlamydia cultures Knows when to go to L&D Has car seat and crib Went to dentist 03/26/22 Not working Here with FOB Walking 2x/wk x 20 min  - GBS  Culture - Chlamydia/GC NAA, Confirmation   Preterm labor symptoms and general obstetric precautions including but not limited to vaginal bleeding, contractions, leaking of fluid and fetal movement were reviewed in detail with the patient. Please refer to After Visit Summary for other counseling recommendations.  Return in about 1 week (around 04/11/2022) for routine PNC.  No future appointments.  Herbie Saxon, CNM

## 2022-04-08 LAB — CULTURE, BETA STREP (GROUP B ONLY): Strep Gp B Culture: NEGATIVE

## 2022-04-10 ENCOUNTER — Other Ambulatory Visit: Payer: Self-pay

## 2022-04-10 ENCOUNTER — Telehealth: Payer: Self-pay

## 2022-04-10 ENCOUNTER — Observation Stay
Admission: EM | Admit: 2022-04-10 | Discharge: 2022-04-10 | Disposition: A | Discharge status: Home / Self Care | Payer: Medicaid Other | Attending: Obstetrics | Admitting: Obstetrics

## 2022-04-10 DIAGNOSIS — A749 Chlamydial infection, unspecified: Secondary | ICD-10-CM | POA: Insufficient documentation

## 2022-04-10 DIAGNOSIS — O98313 Other infections with a predominantly sexual mode of transmission complicating pregnancy, third trimester: Secondary | ICD-10-CM | POA: Diagnosis not present

## 2022-04-10 DIAGNOSIS — O368130 Decreased fetal movements, third trimester, not applicable or unspecified: Principal | ICD-10-CM

## 2022-04-10 DIAGNOSIS — Z3A37 37 weeks gestation of pregnancy: Secondary | ICD-10-CM | POA: Diagnosis not present

## 2022-04-10 LAB — C. TRACHOMATIS NAA, CONFIRM: C. trachomatis NAA, Confirm: POSITIVE — AB

## 2022-04-10 LAB — CHLAMYDIA/GC NAA, CONFIRMATION
Chlamydia trachomatis, NAA: POSITIVE — AB
Neisseria gonorrhoeae, NAA: NEGATIVE

## 2022-04-10 MED ORDER — ACETAMINOPHEN 325 MG PO TABS: 650.0000 mg | ORAL_TABLET | ORAL | Status: DC | PRN

## 2022-04-10 MED ORDER — ONDANSETRON HCL 4 MG/2ML IJ SOLN: 4.0000 mg | Freq: Four times a day (QID) | INTRAMUSCULAR | Status: DC | PRN

## 2022-04-10 MED ORDER — ONDANSETRON 4 MG PO TBDP
8.0000 mg | ORAL_TABLET | Freq: Once | ORAL | Status: AC
  Administered 2022-04-10: 8 mg via ORAL
  Filled 2022-04-10: qty 2

## 2022-04-10 MED ORDER — AZITHROMYCIN 500 MG PO TABS
1000.0000 mg | ORAL_TABLET | Freq: Once | ORAL | Status: AC
  Administered 2022-04-10: 1000 mg via ORAL
  Filled 2022-04-10: qty 2

## 2022-04-10 MED ORDER — LACTATED RINGERS IV SOLN: 500.0000 mL | INTRAVENOUS | Status: DC | PRN

## 2022-04-10 NOTE — Progress Notes (Signed)
RN/CNM reviewed fetal strip to be reactive. Patient tolerated antibiotic PO without further nausea. Encouraged hydration PO at home as patient declined IV fluids at this time. RN reviewed "after visit summary" with patient including medications, appointments, and instructions. All questions answered at this time. Patient ambulated with partner for discharge. No belongings left in room.

## 2022-04-10 NOTE — Telephone Encounter (Signed)
TC to patient to inform her of positive Chlamydia test and need for treatment.  Patient has [facility] appointment on 04/13/22, patient offered appointment on 4/3 or 4/4 for treatment, patient states she will come 4/5 for MH RV and treatment. Patient counseled to have her partner call for appointment to be treated as contact to Chlamydia. Patient states understanding and denies questions at this time.Jenetta Downer, RN

## 2022-04-10 NOTE — Discharge Instructions (Addendum)
Please continue follow up with your OB provider. You were treated today, 4/2, with 1000mg  of Azithromycin. Stay hydrated and monitor for signs of labor. Attached are clinical education materials. Keep your appointment with [facility] for Friday 4/5 and follow up as needed before delivery.

## 2022-04-10 NOTE — OB Triage Note (Signed)
LABOR & DELIVERY OB TRIAGE NOTE  SUBJECTIVE  HPI Briana Robinson is a 22 y.o. G1P0000 at [redacted]w[redacted]d who presents to Labor & Delivery for decreased fetal movement. She reported that she had not felt the baby move since 0100. Briana Robinson recently had a fever and N/V. She is able to keep down liquids now but has been slightly nauseated. She had a positive chlamydia swab on 04/04/22 and has not been treated yet. She denies ctx, LOF, and vaginal bleeding. Fetal movement palpated by RN in triage.  OB History     Gravida  1   Para  0   Term  0   Preterm  0   AB  0   Living  0      SAB  0   IAB  0   Ectopic  0   Multiple  0   Live Births  0           Scheduled Meds:  azithromycin  1,000 mg Oral Once   Continuous Infusions:  lactated ringers     PRN Meds:.acetaminophen, lactated ringers, ondansetron  OBJECTIVE  BP 109/70   Pulse (!) 106   Temp 99 F (37.2 C) (Oral)   LMP 07/15/2021 (Exact Date)    NST I reviewed the NST and it was reactive.  Baseline: 130 Variability: moderate Accelerations: present Decelerations:none Toco: irregular contractions Category 1  ASSESSMENT Impression  1) Pregnancy at G1P0000, [redacted]w[redacted]d, Estimated Date of Delivery: 05/01/22 2) Reassuring maternal/fetal status, RNST 3) Chlamydia  PLAN  1) Discharge home with standard labor/return precautions. Keep scheduled ROB at [place]. 2) Encouraged rest, hydration 3) Azithromycin 1 gram PO given for chlamydia. Will need TOC.  Lloyd Huger, CNM 04/10/22  8:20 PM

## 2022-04-13 ENCOUNTER — Ambulatory Visit: Payer: Medicaid Other | Admitting: Advanced Practice Midwife

## 2022-04-13 VITALS — BP 107/69 | HR 85 | Temp 97.2°F | Wt 145.4 lb

## 2022-04-13 DIAGNOSIS — O98813 Other maternal infectious and parasitic diseases complicating pregnancy, third trimester: Secondary | ICD-10-CM

## 2022-04-13 DIAGNOSIS — Z3403 Encounter for supervision of normal first pregnancy, third trimester: Secondary | ICD-10-CM

## 2022-04-13 DIAGNOSIS — Z3402 Encounter for supervision of normal first pregnancy, second trimester: Secondary | ICD-10-CM

## 2022-04-13 DIAGNOSIS — A749 Chlamydial infection, unspecified: Secondary | ICD-10-CM

## 2022-04-13 NOTE — Progress Notes (Signed)
[facility]  PRENATAL VISIT NOTE  Subjective:  Briana Robinson is a 22 y.o. G1P0000 at [redacted]w[redacted]d being seen today for ongoing prenatal care.  She is currently monitored for the following issues for this low-risk pregnancy and has Encounter for supervision of normal first pregnancy in second trimester; UTI (urinary tract infection) dx'd 10/17/21; Urinary tract infection #2 11/14/21 Klebsiella pneumoniae; Chlamydia infection affecting pregnancy 04/04/22; and Labor and delivery, indication for care on their problem list.  Patient reports no complaints.  Contractions: Not present. Vag. Bleeding: None.  Movement: Present. Denies leaking of fluid/ROM.   The following portions of the patient's history were reviewed and updated as appropriate: allergies, current medications, past family history, past medical history, past social history, past surgical history and problem list. Problem list updated.  Objective:   Vitals:   04/13/22 1445  BP: 107/69  Pulse: 85  Temp: (!) 97.2 F (36.2 C)  Weight: 145 lb 6.4 oz (66 kg)    Fetal Status: Fetal Heart Rate (bpm): 144 Fundal Height: 36 cm Movement: Present  Presentation: Vertex  General:  Alert, oriented and cooperative. Patient is in no acute distress.  Skin: Skin is warm and dry. No rash noted.   Cardiovascular: Normal heart rate noted  Respiratory: Normal respiratory effort, no problems with respiration noted  Abdomen: Soft, gravid, appropriate for gestational age.  Pain/Pressure: Absent     Pelvic: Cervical exam deferred        Extremities: Normal range of motion.  Edema: None  Mental Status: Normal mood and affect. Normal behavior. Normal judgment and thought content.   Assessment and Plan:  Pregnancy: G1P0000 at [redacted]w[redacted]d  1. Chlamydia infection affecting pregnancy in third trimester 10/17/21 Chlamydia neg 04/04/22 Chlamydia + Went to L&D 04/10/22 for decreased fetal movement and treated there for Chlamydia  with Azithromycin 1 gm FOB has STD apt to be treated 04/25/22; counseled no sex until a week after he is treated  2. Encounter for supervision of normal first pregnancy in second trimester 10 lb 6.4 oz (4.717 kg) Here with FOB Has car seat and crib Walking 2x/wk x 20 min Not working 4 lbs wt gain in last 3 wks Knows when to go to L&D    Preterm labor symptoms and general obstetric precautions including but not limited to vaginal bleeding, contractions, leaking of fluid and fetal movement were reviewed in detail with the patient. Please refer to After Visit Summary for other counseling recommendations.  Return in about 1 week (around 04/20/2022) for routine PNC.  No future appointments.  Alberteen Spindle, CNM

## 2022-04-13 NOTE — Progress Notes (Signed)
Client with positive chlamydia from 36 week cultures on 04/04/2022 at [facility]. [facility] L & D evaluation 04/10/2022 and treated with Azithromycin 1 gram po. Today counseled regarding: no sex with anyone x7 days, no sex with partner until 7 days after his treatment is finished (counseled he may take a different medicine for treatment as chlamydia contact). Per partner in room with client, he has [facility] appt scheduled for 04/25/2022. Counseled to ask at info booth today to determine if sooner appt available. Jossie Ng, RN

## 2022-04-20 NOTE — Progress Notes (Unsigned)
[facility]  PRENATAL VISIT NOTE  Subjective:  Briana Robinson is a 22 y.o. G1P0000 at [redacted]w[redacted]d being seen today for ongoing prenatal care.  She is currently monitored for the following issues for this low-risk pregnancy and has Encounter for supervision of normal first pregnancy in second trimester; UTI (urinary tract infection) dx'd 10/17/21; Urinary tract infection #2 11/14/21 Klebsiella pneumoniae; Chlamydia infection affecting pregnancy 04/04/22; and Labor and delivery, indication for care on their problem list.  Patient reports {sx:14538}.   .  .   . ***Denies leaking of fluid/ROM.   The following portions of the patient's history were reviewed and updated as appropriate: allergies, current medications, past family history, past medical history, past social history, past surgical history and problem list. Problem list updated.  Objective:  There were no vitals filed for this visit.  Fetal Status:           General:  Alert, oriented and cooperative. Patient is in no acute distress.  Skin: Skin is warm and dry. No rash noted.   Cardiovascular: Normal heart rate noted  Respiratory: Normal respiratory effort, no problems with respiration noted  Abdomen: Soft, gravid, appropriate for gestational age.        Pelvic: {Blank single:19197::"Cervical exam performed","Cervical exam deferred"}        Extremities: Normal range of motion.     Mental Status: Normal mood and affect. Normal behavior. Normal judgment and thought content.   Assessment and Plan:  Pregnancy: G1P0000 at [redacted]w[redacted]d  1. Encounter for supervision of normal first pregnancy in second trimester -taking PNV daily -weight  -exercise -strip membranes?  2. Chlamydia infection affecting pregnancy in third trimester -chlamydia TOC due on 4/23 -is she feeling better??    Term labor symptoms and general obstetric precautions including but not limited to vaginal bleeding, contractions,  leaking of fluid and fetal movement were reviewed in detail with the patient. Please refer to After Visit Summary for other counseling recommendations.  No follow-ups on file.  Future Appointments  Date Time Provider Department Center  04/23/2022  2:00 PM [facility] PROVIDER [facility] None    Lenice Llamas, FNP

## 2022-04-23 ENCOUNTER — Encounter: Payer: Self-pay | Admitting: Family Medicine

## 2022-04-23 ENCOUNTER — Ambulatory Visit: Payer: Medicaid Other | Admitting: Family Medicine

## 2022-04-23 VITALS — BP 115/70 | HR 90 | Temp 97.6°F | Wt 147.0 lb

## 2022-04-23 DIAGNOSIS — Z3403 Encounter for supervision of normal first pregnancy, third trimester: Secondary | ICD-10-CM

## 2022-04-23 DIAGNOSIS — A749 Chlamydial infection, unspecified: Secondary | ICD-10-CM

## 2022-04-23 DIAGNOSIS — O98813 Other maternal infectious and parasitic diseases complicating pregnancy, third trimester: Secondary | ICD-10-CM

## 2022-04-23 DIAGNOSIS — Z3402 Encounter for supervision of normal first pregnancy, second trimester: Secondary | ICD-10-CM

## 2022-04-23 NOTE — Progress Notes (Signed)
Patient here for MH RV at 38 6/7..Demetry Bendickson Brewer-Jensen, RN  

## 2022-04-23 NOTE — Progress Notes (Signed)
[facility] IOL referral faxed with snapshot, confirmation received.Burt Knack, RN

## 2022-04-24 ENCOUNTER — Telehealth: Payer: Self-pay

## 2022-04-24 NOTE — Telephone Encounter (Signed)
TC to patient to inform of [facility] IOL on 05/09/2022 at 0000. Patient counseled to arrive at [facility] on 05/08/22 at 11:45 pm, and go through the emergency entrance. Patient states understanding and is aware of her next MH RV on 05/01/22.Marland KitchenBurt Knack, RN

## 2022-04-26 ENCOUNTER — Other Ambulatory Visit: Payer: Self-pay | Admitting: Obstetrics and Gynecology

## 2022-04-26 DIAGNOSIS — Z3490 Encounter for supervision of normal pregnancy, unspecified, unspecified trimester: Secondary | ICD-10-CM

## 2022-04-30 NOTE — Progress Notes (Unsigned)
[facility]  PRENATAL VISIT NOTE  Subjective:  Briana Robinson is a 22 y.o. G1P0000 at [redacted]w[redacted]d being seen today for ongoing prenatal care.  She is currently monitored for the following issues for this low-risk pregnancy and has Encounter for supervision of normal first pregnancy in second trimester; UTI (urinary tract infection) dx'd 10/17/21; Urinary tract infection #2 11/14/21 Klebsiella pneumoniae; and Chlamydia infection affecting pregnancy 04/04/22 on their problem list.  Patient reports {sx:14538}.   .  .   . Denies leaking of fluid/ROM.   The following portions of the patient's history were reviewed and updated as appropriate: allergies, current medications, past family history, past medical history, past social history, past surgical history and problem list. Problem list updated.  Objective:  There were no vitals filed for this visit.  Fetal Status:           General:  Alert, oriented and cooperative. Patient is in no acute distress.  Skin: Skin is warm and dry. No rash noted.   Cardiovascular: Normal heart rate noted  Respiratory: Normal respiratory effort, no problems with respiration noted  Abdomen: Soft, gravid, appropriate for gestational age.        Pelvic: {Blank single:19197::"Cervical exam performed","Cervical exam deferred"}        Extremities: Normal range of motion.     Mental Status: Normal mood and affect. Normal behavior. Normal judgment and thought content.   Assessment and Plan:  Pregnancy: G1P0000 at [redacted]w[redacted]d  1. Encounter for supervision of normal first pregnancy in second trimester -taking PNV daily -IOL is scheduled for 05/09/22 -membrane strip today? -weight -exercise? Encouraged lots of walking, and hydration -knows when to go to L&D   Term labor symptoms and general obstetric precautions including but not limited to vaginal bleeding, contractions, leaking of fluid and fetal movement were reviewed in detail with  the patient. Please refer to After Visit Summary for other counseling recommendations.  No follow-ups on file.  Future Appointments  Date Time Provider Department Center  05/01/2022  3:30 PM [facility] PROVIDER [facility] None    Lenice Llamas, FNP

## 2022-05-01 ENCOUNTER — Inpatient Hospital Stay: Admit: 2022-05-01 | Payer: Self-pay

## 2022-05-01 ENCOUNTER — Encounter: Payer: Self-pay | Admitting: Family Medicine

## 2022-05-01 ENCOUNTER — Ambulatory Visit: Payer: Medicaid Other | Admitting: Family Medicine

## 2022-05-01 VITALS — BP 126/79 | HR 74 | Temp 97.3°F | Wt 148.0 lb

## 2022-05-01 DIAGNOSIS — Z3403 Encounter for supervision of normal first pregnancy, third trimester: Secondary | ICD-10-CM

## 2022-05-01 DIAGNOSIS — Z3402 Encounter for supervision of normal first pregnancy, second trimester: Secondary | ICD-10-CM

## 2022-05-01 NOTE — Progress Notes (Signed)
Here today for 40.0 week MH RV. Taking PNV QD. Has 05/09/22 IOL with [facility] and is aware. Denies ED/hospital visits since last RV. GC/Chlamydia TOC today. Tawny Hopping, RN

## 2022-05-03 ENCOUNTER — Observation Stay (HOSPITAL_BASED_OUTPATIENT_CLINIC_OR_DEPARTMENT_OTHER)
Admission: EM | Admit: 2022-05-03 | Discharge: 2022-05-04 | Disposition: A | Discharge status: Home / Self Care | Payer: Medicaid Other | Source: Home / Self Care | Admitting: Obstetrics and Gynecology

## 2022-05-03 ENCOUNTER — Other Ambulatory Visit: Payer: Self-pay

## 2022-05-03 ENCOUNTER — Encounter: Payer: Self-pay | Admitting: Obstetrics and Gynecology

## 2022-05-03 ENCOUNTER — Observation Stay
Admission: EM | Admit: 2022-05-03 | Discharge: 2022-05-03 | Disposition: A | Discharge status: Home / Self Care | Payer: Medicaid Other | Source: Home / Self Care | Admitting: Certified Nurse Midwife

## 2022-05-03 DIAGNOSIS — O2340 Unspecified infection of urinary tract in pregnancy, unspecified trimester: Secondary | ICD-10-CM

## 2022-05-03 DIAGNOSIS — O471 False labor at or after 37 completed weeks of gestation: Secondary | ICD-10-CM | POA: Insufficient documentation

## 2022-05-03 DIAGNOSIS — O26893 Other specified pregnancy related conditions, third trimester: Secondary | ICD-10-CM | POA: Insufficient documentation

## 2022-05-03 DIAGNOSIS — Z3A40 40 weeks gestation of pregnancy: Secondary | ICD-10-CM | POA: Insufficient documentation

## 2022-05-03 DIAGNOSIS — A749 Chlamydial infection, unspecified: Secondary | ICD-10-CM

## 2022-05-03 DIAGNOSIS — Z3402 Encounter for supervision of normal first pregnancy, second trimester: Secondary | ICD-10-CM

## 2022-05-03 DIAGNOSIS — R109 Unspecified abdominal pain: Secondary | ICD-10-CM | POA: Insufficient documentation

## 2022-05-03 LAB — CHLAMYDIA/GC NAA, CONFIRMATION
Chlamydia trachomatis, NAA: NEGATIVE
Neisseria gonorrhoeae, NAA: NEGATIVE

## 2022-05-03 MED ORDER — ONDANSETRON HCL 4 MG/2ML IJ SOLN: 4.0000 mg | Freq: Four times a day (QID) | INTRAMUSCULAR | Status: DC | PRN

## 2022-05-03 MED ORDER — ACETAMINOPHEN 325 MG PO TABS: 650.0000 mg | ORAL_TABLET | ORAL | Status: DC | PRN

## 2022-05-03 MED ORDER — SOD CITRATE-CITRIC ACID 500-334 MG/5ML PO SOLN: 30.0000 mL | ORAL | Status: DC | PRN

## 2022-05-03 NOTE — OB Triage Note (Signed)
LABOR & DELIVERY OB TRIAGE NOTE  SUBJECTIVE  HPI Briana Robinson is a 22 y.o. G1P0000 at [redacted]w[redacted]d who presents to Labor & Delivery for contractions  OB History     Gravida  1   Para  0   Term  0   Preterm  0   AB  0   Living  0      SAB  0   IAB  0   Ectopic  0   Multiple  0   Live Births  0            PRN Meds:.acetaminophen, ondansetron, sodium citrate-citric acid  OBJECTIVE  BP 114/69 (BP Location: Left Arm)   Pulse 83   Temp 98.3 F (36.8 C) (Oral)   Resp 18   Ht  (1.6 m)   Wt 67.1 kg   LMP 07/15/2021 (Exact Date)   BMI 26.22 kg/m   General: A&Ox4, NAD Heart: normal rate Lungs: normal work of breathing Abdomen: gravid, nontender Cervical exam: Dilation: 1 Effacement (%): 70 Cervical Position: Posterior Station: -2 Presentation: Vertex Exam by:: CNM   NST I reviewed the NST and it was reactive.  Baseline: 130 Variability: moderate Accelerations: present Decelerations:none Toco: irregular, 3-38m, 60-100s, mild Category I  ASSESSMENT Impression  1) Pregnancy at G1P0000, [redacted]w[redacted]d, Estimated Date of Delivery: 05/01/22 2) Reassuring maternal/fetal status  PLAN Discharge home with term labor & fetal movement precautions. Maintain next scheduled visit with [facility], PDIOL scheduled 05/09/22.  Dominica Severin, CNM 05/03/22  1:20 PM

## 2022-05-03 NOTE — OB Triage Note (Signed)
Patient is a 22 yo, G1P0, at 40 weeks 2 days. Patient presents with complaints of contractions that started around 0700 today. She states the contractions are about 10 minutes apart.  Patient denies any vaginal bleeding or LOF. Patient reports +FM. Monitors applied and assessing. VSS. Initial fetal heart tone 125. SVE 1/70-80/-2. [place] CNM notified of patients arrival to unit. Plan to place in observation for labor eval.

## 2022-05-03 NOTE — Progress Notes (Signed)
Discharge instructions provided to patient. Patient verbalized understanding. Pt educated on signs and symptoms of labor, vaginal bleeding, LOF, and fetal movement. Red flag signs reviewed by RN. Patient discharged home with significant other in stable condition.  

## 2022-05-04 ENCOUNTER — Encounter: Payer: Self-pay | Admitting: Obstetrics and Gynecology

## 2022-05-04 ENCOUNTER — Other Ambulatory Visit: Payer: Self-pay

## 2022-05-04 ENCOUNTER — Inpatient Hospital Stay
Admission: EM | Admit: 2022-05-04 | Discharge: 2022-05-07 | DRG: 807 | Disposition: A | Discharge status: Home / Self Care | Payer: Medicaid Other | Attending: Certified Nurse Midwife | Admitting: Certified Nurse Midwife

## 2022-05-04 DIAGNOSIS — Z3A40 40 weeks gestation of pregnancy: Secondary | ICD-10-CM

## 2022-05-04 DIAGNOSIS — Z3490 Encounter for supervision of normal pregnancy, unspecified, unspecified trimester: Secondary | ICD-10-CM

## 2022-05-04 DIAGNOSIS — Z3403 Encounter for supervision of normal first pregnancy, third trimester: Secondary | ICD-10-CM | POA: Diagnosis not present

## 2022-05-04 DIAGNOSIS — O9903 Anemia complicating the puerperium: Secondary | ICD-10-CM | POA: Diagnosis not present

## 2022-05-04 DIAGNOSIS — O480 Post-term pregnancy: Secondary | ICD-10-CM | POA: Diagnosis not present

## 2022-05-04 DIAGNOSIS — D62 Acute posthemorrhagic anemia: Secondary | ICD-10-CM | POA: Diagnosis not present

## 2022-05-04 DIAGNOSIS — Z8249 Family history of ischemic heart disease and other diseases of the circulatory system: Secondary | ICD-10-CM

## 2022-05-04 DIAGNOSIS — O26893 Other specified pregnancy related conditions, third trimester: Secondary | ICD-10-CM | POA: Diagnosis not present

## 2022-05-04 DIAGNOSIS — R109 Unspecified abdominal pain: Secondary | ICD-10-CM

## 2022-05-04 LAB — CBC
HCT: 37 % (ref 36.0–46.0)
Hemoglobin: 11.9 g/dL — ABNORMAL LOW (ref 12.0–15.0)
MCH: 24.9 pg — ABNORMAL LOW (ref 26.0–34.0)
MCHC: 32.2 g/dL (ref 30.0–36.0)
MCV: 77.4 fL — ABNORMAL LOW (ref 80.0–100.0)
Platelets: 329 10*3/uL (ref 150–400)
RBC: 4.78 MIL/uL (ref 3.87–5.11)
RDW: 15 % (ref 11.5–15.5)
WBC: 10.5 10*3/uL (ref 4.0–10.5)
nRBC: 0 % (ref 0.0–0.2)

## 2022-05-04 LAB — TYPE AND SCREEN

## 2022-05-04 MED ORDER — OXYTOCIN-SODIUM CHLORIDE 30-0.9 UT/500ML-% IV SOLN
2.5000 [IU]/h | INTRAVENOUS | Status: DC
  Administered 2022-05-05: 2.5 [IU]/h via INTRAVENOUS
  Filled 2022-05-04: qty 500

## 2022-05-04 MED ORDER — AMMONIA AROMATIC IN INHA
RESPIRATORY_TRACT | Status: AC
  Filled 2022-05-04: qty 10

## 2022-05-04 MED ORDER — LACTATED RINGERS IV SOLN
INTRAVENOUS | Status: DC
  Administered 2022-05-04: 900 mL via INTRAVENOUS

## 2022-05-04 MED ORDER — ONDANSETRON HCL 4 MG/2ML IJ SOLN: 4.0000 mg | Freq: Four times a day (QID) | INTRAMUSCULAR | Status: DC | PRN

## 2022-05-04 MED ORDER — ACETAMINOPHEN 325 MG PO TABS: 650.0000 mg | ORAL_TABLET | ORAL | Status: DC | PRN

## 2022-05-04 MED ORDER — SOD CITRATE-CITRIC ACID 500-334 MG/5ML PO SOLN: 30.0000 mL | ORAL | Status: DC | PRN

## 2022-05-04 MED ORDER — PROMETHAZINE HCL 25 MG PO TABS
25.0000 mg | ORAL_TABLET | Freq: Once | ORAL | Status: AC
  Administered 2022-05-04: 25 mg via ORAL
  Filled 2022-05-04: qty 1

## 2022-05-04 MED ORDER — LIDOCAINE HCL (PF) 1 % IJ SOLN: 30.0000 mL | INTRAMUSCULAR | Status: DC | PRN

## 2022-05-04 MED ORDER — MORPHINE SULFATE (PF) 4 MG/ML IV SOLN
5.0000 mg | Freq: Once | INTRAVENOUS | Status: AC
  Administered 2022-05-04: 5 mg via INTRAVENOUS
  Filled 2022-05-04: qty 2

## 2022-05-04 MED ORDER — FENTANYL CITRATE (PF) 100 MCG/2ML IJ SOLN
50.0000 ug | INTRAMUSCULAR | Status: DC | PRN
  Administered 2022-05-04 – 2022-05-05 (×2): 100 ug via INTRAVENOUS
  Filled 2022-05-04 (×2): qty 2

## 2022-05-04 MED ORDER — LACTATED RINGERS IV BOLUS: 1000.0000 mL | Freq: Once | INTRAVENOUS | Status: DC

## 2022-05-04 MED ORDER — LACTATED RINGERS IV SOLN
500.0000 mL | INTRAVENOUS | Status: DC | PRN
  Administered 2022-05-05 (×2): 500 mL via INTRAVENOUS

## 2022-05-04 MED ORDER — TERBUTALINE SULFATE 1 MG/ML IJ SOLN: 0.2500 mg | Freq: Once | INTRAMUSCULAR | Status: DC | PRN

## 2022-05-04 MED ORDER — MISOPROSTOL 50MCG HALF TABLET: 50.0000 ug | ORAL_TABLET | Freq: Once | ORAL | Status: DC

## 2022-05-04 MED ORDER — LIDOCAINE HCL (PF) 1 % IJ SOLN
INTRAMUSCULAR | Status: AC
  Filled 2022-05-04: qty 30

## 2022-05-04 MED ORDER — LACTATED RINGERS IV SOLN: INTRAVENOUS | Status: DC

## 2022-05-04 MED ORDER — MISOPROSTOL 200 MCG PO TABS
ORAL_TABLET | ORAL | Status: AC
  Filled 2022-05-04: qty 4

## 2022-05-04 MED ORDER — LACTATED RINGERS IV SOLN
500.0000 mL | INTRAVENOUS | Status: DC | PRN
  Administered 2022-05-04: 500 mL via INTRAVENOUS

## 2022-05-04 MED ORDER — MORPHINE SULFATE (PF) 10 MG/ML IV SOLN
10.0000 mg | Freq: Once | INTRAVENOUS | Status: AC
  Administered 2022-05-04: 10 mg via INTRAMUSCULAR
  Filled 2022-05-04: qty 1

## 2022-05-04 MED ORDER — OXYTOCIN BOLUS FROM INFUSION
333.0000 mL | Freq: Once | INTRAVENOUS | Status: AC
  Administered 2022-05-05: 333 mL via INTRAVENOUS

## 2022-05-04 MED ORDER — MISOPROSTOL 50MCG HALF TABLET: 50.0000 ug | ORAL_TABLET | ORAL | Status: DC | PRN

## 2022-05-04 MED ORDER — OXYCODONE-ACETAMINOPHEN 5-325 MG PO TABS: 2.0000 | ORAL_TABLET | ORAL | Status: DC | PRN

## 2022-05-04 MED ORDER — OXYTOCIN 10 UNIT/ML IJ SOLN
INTRAMUSCULAR | Status: AC
  Filled 2022-05-04: qty 2

## 2022-05-04 MED ORDER — OXYCODONE-ACETAMINOPHEN 5-325 MG PO TABS: 1.0000 | ORAL_TABLET | ORAL | Status: DC | PRN

## 2022-05-04 NOTE — Progress Notes (Signed)
Patient awake after therapeutic rest. She reports contractions have spaced out some and are feeling stronger.  BP (!) 109/56 (BP Location: Left Arm)   Pulse 73   Temp 97.7 F (36.5 C) (Oral)   Resp 18   LMP 07/15/2021 (Exact Date)   Cervical exam: 3/80-90/-1  Discussed likely early labor and we can recheck in an hour or two for additional cervical change vs going home to wait for contractions to get closer. She will have a meal and then walk for a little while before recheck.   Tresea Mall, CNM

## 2022-05-04 NOTE — OB Triage Note (Addendum)
LABOR & DELIVERY OB TRIAGE NOTE  SUBJECTIVE  HPI Briana Robinson is a 22 y.o. G1P0000 at [redacted]w[redacted]d who presents to Labor & Delivery for contractions. She was seen this morning for contractions and has been contracting all day. She reports good fetal movement. She denies LOF and vaginal bleeding. She is feeling exhausted. We discussed rechecking cervix in a few hours, going home, or therapeutic rest with morphine. [name] desires therapeutic rest at this time.  OB History     Gravida  1   Para  0   Term  0   Preterm  0   AB  0   Living  0      SAB  0   IAB  0   Ectopic  0   Multiple  0   Live Births  0           Scheduled Meds:   morphine injection  5 mg Intravenous Once    morphine injection  10 mg Intramuscular Once   promethazine  25 mg Oral Once   Continuous Infusions:  lactated ringers     lactated ringers     PRN Meds:.acetaminophen, lactated ringers, ondansetron, sodium citrate-citric acid  OBJECTIVE  LMP 07/15/2021 (Exact Date)   General: alert, cooperative, fatigued Abdomen: soft, gravid Cervical exam: 1/70/-2  NST I reviewed the NST and it was reactive.  Baseline: 130 Variability: moderate Accelerations: present Decelerations:none Toco: q 3 min Category 1  ASSESSMENT Impression  1) Pregnancy at G1P0000, [redacted]w[redacted]d, Estimated Date of Delivery: 05/01/22. Reassuring maternal/fetal status 2) Prodromal labor  PLAN  1) Therapeutic rest with morphine, Phenergan, and IV fluids   Guadlupe Spanish, CNM 05/04/22  12:42 AM

## 2022-05-04 NOTE — OB Triage Note (Signed)

## 2022-05-04 NOTE — Discharge Summary (Signed)
Physician Final Progress Note  Patient ID: Briana Robinson MRN: [id number] DOB/AGE: Mar 13, 2000 22 y.o.  Admit date: 05/03/2022 Admitting provider: Hildred Laser, MD Discharge date: 05/04/2022   Admission Diagnoses:  1) intrauterine pregnancy at [redacted]w[redacted]d  2) latent labor  Discharge Diagnoses:  Active Problems:   Labor and delivery, indication for care   [redacted] weeks gestation of pregnancy    History of Present Illness: The patient is a 22 y.o. female G1P0000 at [redacted]w[redacted]d who was admitted overnight for therapeutic rest. She has been contracting for the past couple days with limited cervical change. When she woke up this morning her cervix had changed to 3 cm from 1.5 cm. Offered recheck in a couple hours. She reported contractions having spaced out some but feeling stronger. At recheck there had been some minimal additional change. At that time she elected to discharge to home to await active labor. Instructions and precautions reviewed prior to discharge.  History reviewed. No pertinent past medical history.  Past Surgical History:  Procedure Laterality Date   denies surgical history      No current facility-administered medications on file prior to encounter.   Current Outpatient Medications on File Prior to Encounter  Medication Sig Dispense Refill   Prenatal Vit-Fe Fumarate-FA (PRENATAL MULTIVITAMIN) TABS tablet Take 1 tablet by mouth daily at 12 noon.      No Known Allergies  Social History   Socioeconomic History   Marital status: Significant Other    Spouse name: Not on file   Number of children: 0   Years of education: 12   Highest education level: High school graduate  Occupational History   Occupation: Conservation officer, nature    Comment: Conservation officer, nature at [employer].  Tobacco Use   Smoking status: Never    Passive exposure: Never   Smokeless tobacco: Never   Tobacco comments:    States last vaped in 2021  Vaping Use   Vaping Use: Former   Substances: Nicotine  Substance and Sexual Activity    Alcohol use: Not Currently    Comment: last alcohol 05/22/21--21st birthday   Drug use: Never   Sexual activity: Yes    Partners: Male    Birth control/protection: Condom    Comment: has used condoms occasionally  Other Topics Concern   Not on file  Social History Narrative   Lives with her brother and her mom   Social Determinants of Health   Financial Resource Strain: Low Risk  (10/17/2021)   Overall Financial Resource Strain (CARDIA)    Difficulty of Paying Living Expenses: Not very hard  Food Insecurity: No Food Insecurity (10/17/2021)   Hunger Vital Sign    Worried About Running Out of Food in the Last Year: Never true    Ran Out of Food in the Last Year: Never true  Transportation Needs: No Transportation Needs (10/17/2021)   PRAPARE - Administrator, Civil Service (Medical): No    Lack of Transportation (Non-Medical): No  Physical Activity: Not on file  Stress: Not on file  Social Connections: Not on file  Intimate Partner Violence: Not At Risk (10/17/2021)   Humiliation, Afraid, Rape, and Kick questionnaire    Fear of Current or Ex-Partner: No    Emotionally Abused: No    Physically Abused: No    Sexually Abused: No    Family History  Problem Relation Age of Onset   Healthy Mother    Healthy Father    Diabetes Maternal Grandmother    Hypertension Maternal Grandfather  Healthy Maternal Grandfather    Stroke Paternal Grandmother    Seizures Paternal Grandfather      Review of Systems  Constitutional:  Negative for chills and fever.  HENT:  Negative for congestion, ear discharge, ear pain, hearing loss, sinus pain and sore throat.   Eyes:  Negative for blurred vision and double vision.  Respiratory:  Negative for cough, shortness of breath and wheezing.   Cardiovascular:  Negative for chest pain, palpitations and leg swelling.  Gastrointestinal:  Positive for abdominal pain. Negative for blood in stool, constipation, diarrhea, heartburn, melena,  nausea and vomiting.  Genitourinary:  Negative for dysuria, flank pain, frequency, hematuria and urgency.  Musculoskeletal:  Negative for back pain, joint pain and myalgias.  Skin:  Negative for itching and rash.  Neurological:  Negative for dizziness, tingling, tremors, sensory change, speech change, focal weakness, seizures, loss of consciousness, weakness and headaches.  Endo/Heme/Allergies:  Negative for environmental allergies. Does not bruise/bleed easily.  Psychiatric/Behavioral:  Negative for depression, hallucinations, memory loss, substance abuse and suicidal ideas. The patient is not nervous/anxious and does not have insomnia.      Physical Exam: BP (!) 109/56 (BP Location: Left Arm)   Pulse 73   Temp 97.7 F (36.5 C) (Oral)   Resp 18   LMP 07/15/2021 (Exact Date)   Constitutional: Well nourished, well developed female in no acute distress.  HEENT: normal Skin: Warm and dry.  Cardiovascular: Regular rate and rhythm.   Extremity:  no edema   Respiratory: Clear to auscultation bilateral. Normal respiratory effort Abdomen: FHT present Psych: Alert and Oriented x3. No memory deficits. Normal mood and affect.  MS: normal gait, normal bilateral lower extremity ROM/strength/stability.  Pelvic exam: (female chaperone present) is not limited by body habitus EGBUS: within normal limits Vagina: within normal limits and with normal mucosa blood in the vault Cervix: 3.5/90/-1  Toco: every 5-8 minutes Fetal well being: 125 beats per minute, moderate variability, +accelerations, -decelerations  Consults: None  Significant Findings/ Diagnostic Studies: none  Procedures: NST  Hospital Course: The patient was admitted to Labor and Delivery Triage for observation.   Discharge Condition: good  Disposition: Discharge disposition: 01-Home or Self Care  Diet: Regular diet  Discharge Activity: Activity as tolerated  Discharge Instructions     Discharge activity:  No  Restrictions   Complete by: As directed    Discharge diet:  No restrictions   Complete by: As directed    LABOR:  When conractions begin, you should start to time them from the beginning of one contraction to the beginning  of the next.  When contractions are 5 - 10 minutes apart or less and have been regular for at least an hour, you should call your health care provider.   Complete by: As directed    Notify physician for bleeding from the vagina   Complete by: As directed    Notify physician for blurring of vision or spots before the eyes   Complete by: As directed    Notify physician for chills or fever   Complete by: As directed    Notify physician for fainting spells, "black outs" or loss of consciousness   Complete by: As directed    Notify physician for increase in vaginal discharge   Complete by: As directed    Notify physician for leaking of fluid   Complete by: As directed    Notify physician for pain or burning when urinating   Complete by: As directed  Notify physician for pelvic pressure (sudden increase)   Complete by: As directed    Notify physician for severe or continued nausea or vomiting   Complete by: As directed    Notify physician for sudden gushing of fluid from the vagina (with or without continued leaking)   Complete by: As directed    Notify physician for sudden, constant, or occasional abdominal pain   Complete by: As directed    Notify physician if baby moving less than usual   Complete by: As directed       Allergies as of 05/04/2022   No Known Allergies      Medication List     TAKE these medications    prenatal multivitamin Tabs tablet Take 1 tablet by mouth daily at 12 noon.         Total time spent taking care of this patient: 35 minutes  Signed: Tresea Mall, CNM  05/04/2022, 2:15 PM

## 2022-05-04 NOTE — Progress Notes (Signed)
Pt arrived to unit for L&D triage as she reports her contractions have worsened since discharge earlier this morning. Pt is G1P0 [redacted]w[redacted]d. Patient reports abdominal cramping that has worsened. Patient placed on EFM and toco. FOB at bedside. Will notify provider on call of patient's arrival.

## 2022-05-05 ENCOUNTER — Encounter: Payer: Self-pay | Admitting: Obstetrics and Gynecology

## 2022-05-05 ENCOUNTER — Inpatient Hospital Stay: Payer: Medicaid Other | Admitting: Anesthesiology

## 2022-05-05 DIAGNOSIS — Z3403 Encounter for supervision of normal first pregnancy, third trimester: Secondary | ICD-10-CM | POA: Diagnosis not present

## 2022-05-05 DIAGNOSIS — O480 Post-term pregnancy: Secondary | ICD-10-CM | POA: Diagnosis not present

## 2022-05-05 DIAGNOSIS — D62 Acute posthemorrhagic anemia: Secondary | ICD-10-CM

## 2022-05-05 DIAGNOSIS — O9903 Anemia complicating the puerperium: Secondary | ICD-10-CM | POA: Diagnosis not present

## 2022-05-05 DIAGNOSIS — Z3A40 40 weeks gestation of pregnancy: Secondary | ICD-10-CM | POA: Diagnosis not present

## 2022-05-05 LAB — ABO/RH: ABO/RH(D): O POS

## 2022-05-05 LAB — RPR: RPR Ser Ql: NONREACTIVE

## 2022-05-05 LAB — TYPE AND SCREEN
ABO/RH(D): O POS
Antibody Screen: NEGATIVE

## 2022-05-05 MED ORDER — PHENYLEPHRINE 80 MCG/ML (10ML) SYRINGE FOR IV PUSH (FOR BLOOD PRESSURE SUPPORT): 80.0000 ug | PREFILLED_SYRINGE | INTRAVENOUS | Status: DC | PRN

## 2022-05-05 MED ORDER — SENNOSIDES-DOCUSATE SODIUM 8.6-50 MG PO TABS
2.0000 | ORAL_TABLET | ORAL | Status: DC
  Administered 2022-05-05: 2 via ORAL
  Filled 2022-05-05 (×2): qty 2

## 2022-05-05 MED ORDER — FERROUS SULFATE 325 (65 FE) MG PO TABS
325.0000 mg | ORAL_TABLET | Freq: Every day | ORAL | Status: DC
  Administered 2022-05-06 – 2022-05-07 (×2): 325 mg via ORAL
  Filled 2022-05-05 (×3): qty 1

## 2022-05-05 MED ORDER — FENTANYL-BUPIVACAINE-NACL 0.5-0.125-0.9 MG/250ML-% EP SOLN
12.0000 mL/h | EPIDURAL | Status: DC | PRN
  Administered 2022-05-05: 12 mL/h via EPIDURAL

## 2022-05-05 MED ORDER — DOCUSATE SODIUM 100 MG PO CAPS
100.0000 mg | ORAL_CAPSULE | Freq: Two times a day (BID) | ORAL | Status: DC
  Administered 2022-05-06 – 2022-05-07 (×3): 100 mg via ORAL
  Filled 2022-05-05 (×3): qty 1

## 2022-05-05 MED ORDER — PHENYLEPHRINE HCL-NACL 20-0.9 MG/250ML-% IV SOLN
INTRAVENOUS | Status: AC
  Filled 2022-05-05: qty 250

## 2022-05-05 MED ORDER — IBUPROFEN 600 MG PO TABS
600.0000 mg | ORAL_TABLET | Freq: Four times a day (QID) | ORAL | Status: DC
  Administered 2022-05-05 – 2022-05-07 (×8): 600 mg via ORAL
  Filled 2022-05-05 (×8): qty 1

## 2022-05-05 MED ORDER — METHYLERGONOVINE MALEATE 0.2 MG/ML IJ SOLN: 0.2000 mg | INTRAMUSCULAR | Status: DC | PRN

## 2022-05-05 MED ORDER — EPHEDRINE 5 MG/ML INJ: 10.0000 mg | INTRAVENOUS | Status: DC | PRN

## 2022-05-05 MED ORDER — OXYCODONE-ACETAMINOPHEN 5-325 MG PO TABS: 2.0000 | ORAL_TABLET | ORAL | Status: DC | PRN

## 2022-05-05 MED ORDER — DIPHENHYDRAMINE HCL 50 MG/ML IJ SOLN: 12.5000 mg | INTRAMUSCULAR | Status: DC | PRN

## 2022-05-05 MED ORDER — LIDOCAINE-EPINEPHRINE (PF) 1.5 %-1:200000 IJ SOLN
INTRAMUSCULAR | Status: DC | PRN
  Administered 2022-05-05: 3 mL via EPIDURAL

## 2022-05-05 MED ORDER — METHYLERGONOVINE MALEATE 0.2 MG PO TABS: 0.2000 mg | ORAL_TABLET | ORAL | Status: DC | PRN

## 2022-05-05 MED ORDER — DIBUCAINE (PERIANAL) 1 % EX OINT
1.0000 | TOPICAL_OINTMENT | CUTANEOUS | Status: DC | PRN
  Filled 2022-05-05: qty 28

## 2022-05-05 MED ORDER — PRENATAL MULTIVITAMIN CH
1.0000 | ORAL_TABLET | Freq: Every day | ORAL | Status: DC
  Administered 2022-05-06: 1 via ORAL
  Filled 2022-05-05: qty 1

## 2022-05-05 MED ORDER — ONDANSETRON HCL 4 MG/2ML IJ SOLN: 4.0000 mg | INTRAMUSCULAR | Status: DC | PRN

## 2022-05-05 MED ORDER — SIMETHICONE 80 MG PO CHEW: 80.0000 mg | CHEWABLE_TABLET | ORAL | Status: DC | PRN

## 2022-05-05 MED ORDER — ONDANSETRON HCL 4 MG PO TABS: 4.0000 mg | ORAL_TABLET | ORAL | Status: DC | PRN

## 2022-05-05 MED ORDER — ACETAMINOPHEN 325 MG PO TABS
650.0000 mg | ORAL_TABLET | ORAL | Status: DC | PRN
  Administered 2022-05-05: 650 mg via ORAL
  Filled 2022-05-05: qty 2

## 2022-05-05 MED ORDER — BENZOCAINE-MENTHOL 20-0.5 % EX AERO
1.0000 | INHALATION_SPRAY | CUTANEOUS | Status: DC | PRN
  Administered 2022-05-05: 1 via TOPICAL
  Filled 2022-05-05 (×2): qty 56

## 2022-05-05 MED ORDER — DIPHENHYDRAMINE HCL 50 MG/ML IJ SOLN
50.0000 mg | Freq: Once | INTRAMUSCULAR | Status: AC
  Administered 2022-05-05: 50 mg via INTRAVENOUS
  Filled 2022-05-05: qty 1

## 2022-05-05 MED ORDER — TERBUTALINE SULFATE 1 MG/ML IJ SOLN: 0.2500 mg | Freq: Once | INTRAMUSCULAR | Status: DC | PRN

## 2022-05-05 MED ORDER — LACTATED RINGERS IV SOLN
500.0000 mL | Freq: Once | INTRAVENOUS | Status: AC
  Administered 2022-05-05: 500 mL via INTRAVENOUS

## 2022-05-05 MED ORDER — WITCH HAZEL-GLYCERIN EX PADS
1.0000 | MEDICATED_PAD | CUTANEOUS | Status: DC | PRN
  Administered 2022-05-05: 1 via TOPICAL
  Filled 2022-05-05 (×2): qty 100

## 2022-05-05 MED ORDER — TETANUS-DIPHTH-ACELL PERTUSSIS 5-2.5-18.5 LF-MCG/0.5 IM SUSY: 0.5000 mL | PREFILLED_SYRINGE | Freq: Once | INTRAMUSCULAR | Status: DC

## 2022-05-05 MED ORDER — COCONUT OIL OIL
1.0000 | TOPICAL_OIL | Status: DC | PRN
  Filled 2022-05-05: qty 15

## 2022-05-05 MED ORDER — FENTANYL-BUPIVACAINE-NACL 0.5-0.125-0.9 MG/250ML-% EP SOLN
EPIDURAL | Status: AC
  Filled 2022-05-05: qty 250

## 2022-05-05 MED ORDER — OXYTOCIN-SODIUM CHLORIDE 30-0.9 UT/500ML-% IV SOLN
1.0000 m[IU]/min | INTRAVENOUS | Status: DC
  Administered 2022-05-05: 2 m[IU]/min via INTRAVENOUS

## 2022-05-05 MED ORDER — SODIUM CHLORIDE 0.9 % IV SOLN
INTRAVENOUS | Status: DC | PRN
  Administered 2022-05-05 (×2): 5 mL via EPIDURAL

## 2022-05-05 MED ORDER — OXYCODONE-ACETAMINOPHEN 5-325 MG PO TABS
1.0000 | ORAL_TABLET | ORAL | Status: DC | PRN
  Administered 2022-05-06: 1 via ORAL
  Filled 2022-05-05: qty 1

## 2022-05-05 MED ORDER — LIDOCAINE HCL (PF) 1 % IJ SOLN
INTRAMUSCULAR | Status: DC | PRN
  Administered 2022-05-05: 1 mL via SUBCUTANEOUS

## 2022-05-05 NOTE — Progress Notes (Signed)
LABOR NOTE   Briana Robinson 21 y.o.GP@ at [redacted]w[redacted]d  SUBJECTIVE:  Comfortable , denies urge to push.  Analgesia: Epidural  OBJECTIVE:  BP 108/77   Pulse 88   Temp 98.6 F (37 C) (Oral)   Resp 16   Ht 5\' 3"  (1.6 m)   Wt 68 kg   LMP 07/15/2021 (Exact Date)   SpO2 99%   BMI 26.56 kg/m  No intake/output data recorded.  She has shown cervical change. CERVIX: 6-7cm:  90:   -2:   mid position:   firm SVE:   Dilation: 6.5 Effacement (%): 90 Station: -2 Exam by:: Janee Morn CNM CONTRACTIONS: regular, every 4 minutes FHR: Fetal heart tracing reviewed. Baseline: 150 bpm, Variability: Good {> 6 bpm), Accelerations: Non-reactive but appropriate for gestational age, and Decelerations: late and variable, recurrent  Category II    Labs: Lab Results  Component Value Date   WBC 10.5 05/04/2022   HGB 11.9 (L) 05/04/2022   HCT 37.0 05/04/2022   MCV 77.4 (L) 05/04/2022   PLT 329 05/04/2022    ASSESSMENT: 1) Labor curve reviewed.       Progress: Active phase labor.     Membranes: ruptured     IUPC       Principal Problem:   Post-dates pregnancy   PLAN:  Position changes made, IV fluid bolus, IUPC in place with amnio infusion. Pitocin is currently off. Discussed with pt and her family concerns regarding fetal strip (category 2). PT would like to avoid cesarean section if possible. Offered pt to restart pitocin to try to help her to get to complete. Discussed trying to improve contractions strength and balance fetal tracing. Discussed possibility of needing to proceed with cesarean section should fetal heart rate variability decline  . She verbalizes understanding and would like to try to start pitocin again. Dr. Dalbert Garnet aware of plan and in agreement.   Doreene Burke, CNM  05/05/2022 12:01 PM

## 2022-05-05 NOTE — Progress Notes (Signed)
LABOR NOTE   Briana Robinson 22 y.o.GP@ at [redacted]w[redacted]d  SUBJECTIVE:  Pt comfortable, resting on her right side.  Analgesia: Epidural  OBJECTIVE:  BP 108/77   Pulse 88   Temp 98.4 F (36.9 C) (Oral)   Resp 16   Ht 5\' 3"  (1.6 m)   Wt 68 kg   LMP 07/15/2021 (Exact Date)   SpO2 99%   BMI 26.56 kg/m  No intake/output data recorded.  She has shown cervical change. CERVIX:  7 cm:  90%:   -2:   mid position:   firm SVE:   Dilation: 7 Effacement (%): 90 Station: 0, -1 Exam by:: Dionte Blaustein CNM CONTRACTIONS: regular, every 2-4 minutes FHR: Fetal heart tracing reviewed. Baseline: 150 bpm, Variability: Good {> 6 bpm), Accelerations: Reactive, and Decelerations: late and variable with vomiting  Category II    Labs: Lab Results  Component Value Date   WBC 10.5 05/04/2022   HGB 11.9 (L) 05/04/2022   HCT 37.0 05/04/2022   MCV 77.4 (L) 05/04/2022   PLT 329 05/04/2022    ASSESSMENT: 1) Labor curve reviewed.       Progress: Active phase labor.     Membranes: ruptured, IUPC placed    Principal Problem:   Post-dates pregnancy   PLAN: Pt vomiting, position change, IUPC placed, pitocin stopped. Discussed amnioinfusion with pt and her family. Will start if variable decelerations continue. Dr. Dalbert Garnet at the bedside with me and agrees with plan.   Doreene Burke, CNM . 05/05/2022 9:24 AM

## 2022-05-05 NOTE — Progress Notes (Signed)
  Labor Progress Note   22 y.o. G1P0000 @ [redacted]w[redacted]d , admitted for  Pregnancy, Labor Management.   Subjective:  Coping well with contractions after IV fentanyl  Objective:  BP 115/65   Pulse 76   Temp 97.8 F (36.6 C) (Oral)   Ht 5\' 3"  (1.6 m)   Wt 68 kg   LMP 07/15/2021 (Exact Date)   BMI 26.56 kg/m  Abd: gravid, ND, FHT present, mild tenderness on exam Extr: no edema SVE: CERVIX: 5 cm dilated, 100 effaced, -1 station AROM thin meconium  EFM: FHR: 125 bpm, variability: moderate,  accelerations:  Present,  decelerations:  Present early Toco: Frequency: Every 2-5 minutes Labs: I have reviewed the patient's lab results.   Assessment & Plan:  G1P0000 @ [redacted]w[redacted]d, admitted for  Pregnancy and Labor/Delivery Management  1. Pain management:  IV analgesia . 2. FWB: FHT category I.  3. ID: GBS negative 4. Labor management: Expectant management for vaginal delivery, consider pitocin titration  All discussed with patient, see orders   Briana Robinson, CNM [facility] 05/05/2022  1:36 AM

## 2022-05-05 NOTE — Progress Notes (Signed)
  Labor Progress Note   22 y.o. G1P0000 @ [redacted]w[redacted]d , admitted for  Pregnancy, Labor Management.   Subjective:  She is comfortable with epidural  Objective:  BP 104/60 (BP Location: Right Arm)   Pulse 86   Temp 98.6 F (37 C) (Oral)   Resp 16   Ht 5\' 3"  (1.6 m)   Wt 68 kg   LMP 07/15/2021 (Exact Date)   SpO2 100%   BMI 26.56 kg/m  Abd: gravid, ND, FHT present, mild tenderness on exam Extr: no edema SVE: CERVIX: 6 cm dilated, 90 effaced with some swelling, -1, 0 station  EFM: FHR: 135 bpm, variability: moderate,  accelerations:  Present,  decelerations:  Present variable/early/late Toco: Frequency: Every 2-4 minutes Labs: I have reviewed the patient's lab results.   Assessment & Plan:  G1P0000 @ [redacted]w[redacted]d, admitted for  Pregnancy and Labor/Delivery Management  1. Pain management: epidural. 2. FWB: FHT category I/II.  3. ID: GBS negative 4. Labor management: fluid bolus, position changes, start pitocin, benadryl for cervical swelling  All discussed with patient, see orders   Tresea Mall, CNM [facility] 05/05/2022  8:44 AM

## 2022-05-05 NOTE — Progress Notes (Signed)
Briana Robinson is a 22 y.o. G1P0000 at [redacted]w[redacted]d, in active labor. Now 7cm but with recurrent late decels. Moderate variability, decreasing. No cervical change in last hour.  Objective: BP 108/77   Pulse 88   Temp 98.4 F (36.9 C) (Oral)   Resp 16   Ht 5\' 3"  (1.6 m)   Wt 68 kg   LMP 07/15/2021 (Exact Date)   SpO2 99%   BMI 26.56 kg/m  No intake/output data recorded. No intake/output data recorded.  FHT:  FHR: 160 bpm, variability: moderate,  accelerations: not present  ,  decelerations:  Present variable and late decels UC:   regular, every 4 minutes SVE:   Dilation: 7 Effacement (%): 90 Station: -2 Exam by:: thompson CNM  Labs: Lab Results  Component Value Date   WBC 10.5 05/04/2022   HGB 11.9 (L) 05/04/2022   HCT 37.0 05/04/2022   MCV 77.4 (L) 05/04/2022   PLT 329 05/04/2022    Assessment / Plan: Spontaneous labor, progressing normally  Fetal Wellbeing:  Category II Pain Control:  Epidural Anticipated MOD:   concern for fetal intolerance, we have discussed my concerns. She would like to delay and avoid c/s if she can, and we will continue for another 30 min, restarting pitocin. If no improvement, I recommend expedited delivery   Christeen Douglas, MD 05/05/2022, 11:00 AM

## 2022-05-05 NOTE — Progress Notes (Signed)
To the bedside for monitoring with continued decels. Improvement in variability since last exam.  Dilation: 9 Effacement (%): 90 Cervical Position: Anterior Station: 0 Presentation: Vertex Exam by:: Janee Morn CNM  FHT: 150s, mod var, +late decels intermittently, +accel with +fetal scalp stim just now at 13:12.  Discussion of labor outcomes, reasons for urgent delivery with maternal distress noted. Confirmed +scalp stim with mom, proceed with attempted vaginal delivery. Monitor continuously.

## 2022-05-05 NOTE — Anesthesia Preprocedure Evaluation (Signed)
Anesthesia Evaluation  Patient identified by MRN, date of birth, ID band Patient awake    Reviewed: Allergy & Precautions, NPO status , Patient's Chart, lab work & pertinent test results  History of Anesthesia Complications Negative for: history of anesthetic complications  Airway Mallampati: III  TM Distance: >3 FB Neck ROM: full    Dental  (+) Chipped   Pulmonary neg pulmonary ROS   Pulmonary exam normal        Cardiovascular Exercise Tolerance: Good (-) hypertensionnegative cardio ROS Normal cardiovascular exam     Neuro/Psych    GI/Hepatic negative GI ROS,,,  Endo/Other    Renal/GU   negative genitourinary   Musculoskeletal   Abdominal   Peds  Hematology negative hematology ROS (+)   Anesthesia Other Findings History reviewed. No pertinent past medical history.  Past Surgical History: No date: denies surgical history  BMI    Body Mass Index: 26.56 kg/m      Reproductive/Obstetrics (+) Pregnancy                             Anesthesia Physical Anesthesia Plan  ASA: 2  Anesthesia Plan: Epidural   Post-op Pain Management:    Induction:   PONV Risk Score and Plan:   Airway Management Planned: Natural Airway  Additional Equipment:   Intra-op Plan:   Post-operative Plan:   Informed Consent: I have reviewed the patients History and Physical, chart, labs and discussed the procedure including the risks, benefits and alternatives for the proposed anesthesia with the patient or authorized representative who has indicated his/her understanding and acceptance.     Dental Advisory Given  Plan Discussed with: Anesthesiologist  Anesthesia Plan Comments: (Patient reports no bleeding problems and no anticoagulant use.   Patient consented for risks of anesthesia including but not limited to:  - adverse reactions to medications - risk of bleeding, infection and or nerve  damage from epidural that could lead to paralysis - risk of headache or failed epidural - nerve damage due to positioning - that if epidural is used for C-section that there is a chance of epidural failure requiring spinal placement or conversion to GA - Damage to heart, brain, lungs, other parts of body or loss of life  Patient voiced understanding.)       Anesthesia Quick Evaluation

## 2022-05-05 NOTE — Anesthesia Procedure Notes (Signed)
Epidural Patient location during procedure: OB Start time: 05/05/2022 2:16 AM End time: 05/05/2022 2:21 AM  Staffing Anesthesiologist: Odis Turck, Cleda Mccreedy, MD Performed: anesthesiologist   Preanesthetic Checklist Completed: patient identified, IV checked, site marked, risks and benefits discussed, surgical consent, monitors and equipment checked, pre-op evaluation and timeout performed  Epidural Patient position: sitting Prep: ChloraPrep Patient monitoring: heart rate, continuous pulse ox and blood pressure Approach: midline Location: L3-L4 Injection technique: LOR saline  Needle:  Needle type: Tuohy  Needle gauge: 17 G Needle length: 9 cm and 9 Needle insertion depth: 6 cm Catheter type: closed end flexible Catheter size: 19 Gauge Catheter at skin depth: 11 cm Test dose: negative and 1.5% lidocaine with Epi 1:200 K  Assessment Sensory level: T10 Events: blood not aspirated, no cerebrospinal fluid, injection not painful, no injection resistance, no paresthesia and negative IV test  Additional Notes 1 attempt Pt. Evaluated and documentation done after procedure finished. Patient identified. Risks/Benefits/Options discussed with patient including but not limited to bleeding, infection, nerve damage, paralysis, failed block, incomplete pain control, headache, blood pressure changes, nausea, vomiting, reactions to medication both or allergic, itching and postpartum back pain. Confirmed with bedside nurse the patient's most recent platelet count. Confirmed with patient that they are not currently taking any anticoagulation, have any bleeding history or any family history of bleeding disorders. Patient expressed understanding and wished to proceed. All questions were answered. Sterile technique was used throughout the entire procedure. Please see nursing notes for vital signs. Test dose was given through epidural catheter and negative prior to continuing to dose epidural or start  infusion. Warning signs of high block given to the patient including shortness of breath, tingling/numbness in hands, complete motor block, or any concerning symptoms with instructions to call for help. Patient was given instructions on fall risk and not to get out of bed. All questions and concerns addressed with instructions to call with any issues or inadequate analgesia.    Patient tolerated the insertion well without immediate complications.Reason for block:procedure for pain

## 2022-05-05 NOTE — H&P (Cosign Needed Addendum)
OB History & Physical   History of Present Illness:  Chief Complaint: contractions  HPI:  Briana Robinson is a 22 y.o. G1P0000 female at [redacted]w[redacted]d dated by 12 week ultrasound.  Her pregnancy has been mildly complicated by UTI, chlamydia infection with negative test of cure .    She reports contractions.   She denies leakage of fluid.   She denies vaginal bleeding.   She reports fetal movement.    Total weight gain for pregnancy: 5.896 kg   Obstetrical Problem List: Karger, sharita Problems (from 10/17/21 to present)     Problem Noted Resolved   Chlamydia infection affecting pregnancy 04/04/22 04/10/2022 by Burt Knack, RN No   Overview Addendum 04/13/2022  2:23 PM by Jossie Ng, RN    [X]    treated with Azithromycin 1 gm po L & D 04/10/22 [  ]   TOC 3 weeks   (~ 05/01/22) [  ]   TOC 3 months  (~07/10/22)      Urinary tract infection #2 11/14/21 Klebsiella pneumoniae 11/20/2021 by Alberteen Spindle, CNM No   Overview Addendum 12/14/2021 10:26 AM by Lenice Llamas, FNP    E-rx'd Augmentin 500-875 mg po BID x 10 days; pt chooses to get Ceftriaxone IM instead of Augmentin po Given Ceftriaxone IM on 11/21/21 TOC 12/12/21 (neg)- 10-25k MUF      Encounter for supervision of normal first pregnancy in second trimester 10/17/2021 by Alberteen Spindle, CNM No   Overview Addendum 04/09/2022  9:50 AM by Jossie Ng, RN     Nursing Staff Provider  Office Location [facility] Dating  On 10/19/21@12  2/7=05/01/22  Language  English Anatomy US  12/21/21@21  3/7=05/01/22 wnl  Flu Vaccine  Declined flu vaccine 10/17/21 Genetic Screen  NIPS:  10/17/21  =    neg    AFP:    11/14/21  =neg   TDaP vaccine  Declined 02/20/22 Hgb A1C or  GTT Early  Third trimester 1 hour gtt= 123 (02/06/22)  COVID vaccine Declines 10/17/2021    Rhogam     LAB RESULTS   Feeding Plan Breast Blood Type   O positive                (10/17/21)  Contraception Condoms Antibody   negative                   (10/17/21)  Circumcision  N/A- girl Rubella  MMR x 2    (05/28/2001, 09/06/2005)  immune by titer          (10/17/21)  Pediatrician  [facility]  non-reactive              (10/17/21)  Support Person  HBsAg  negative                    (10/17/21)  Prenatal Classes  HIV  non-reactive              (10/17/21)  @28wk - Doula referral?  Varicella Varivax x 2    (05/28/2001, 09/06/2005)    HCV   non-reactive              (10/17/21)  BTL Consent  GBS  Negative                   (04/04/22)       VBAC Consent N/A Pap  10/17/21  =    neg    Hgb Electro    Normal                       (  10/17/21)  BP Cuff ordered  CF   Delivery Group [facility]   Centering Group  [facility] involved               Maternal Medical History:  History reviewed. No pertinent past medical history.  Past Surgical History:  Procedure Laterality Date   denies surgical history      No Known Allergies  Prior to Admission medications   Medication Sig Start Date End Date Taking? Authorizing Provider  Prenatal Vit-Fe Fumarate-FA (PRENATAL MULTIVITAMIN) TABS tablet Take 1 tablet by mouth daily at 12 noon.    [provider]    OB History  Gravida Para Term Preterm AB Living  1 0 0 0 0 0  SAB IAB Ectopic Multiple Live Births  0 0 0 0 0    # Outcome Date GA Lbr Len/2nd Weight Sex Delivery Anes PTL Lv  1 Current             Prenatal care site: [facility]  Social History: She  reports that she has never smoked. She has never been exposed to tobacco smoke. She has never used smokeless tobacco. She reports that she does not currently use alcohol. She reports that she does not use drugs.  Family History: family history includes Diabetes in her maternal grandmother; Healthy in her father, maternal grandfather, and mother; Hypertension in her maternal grandfather; Seizures in her paternal grandfather; Stroke in her paternal grandmother.    Review of Systems:  Review of Systems  Constitutional:  Negative for chills and fever.   HENT:  Negative for congestion, ear discharge, ear pain, hearing loss, sinus pain and sore throat.   Eyes:  Negative for blurred vision and double vision.  Respiratory:  Negative for cough, shortness of breath and wheezing.   Cardiovascular:  Negative for chest pain, palpitations and leg swelling.  Gastrointestinal:  Positive for abdominal pain. Negative for blood in stool, constipation, diarrhea, heartburn, melena, nausea and vomiting.  Genitourinary:  Negative for dysuria, flank pain, frequency, hematuria and urgency.  Musculoskeletal:  Negative for back pain, joint pain and myalgias.  Skin:  Negative for itching and rash.  Neurological:  Negative for dizziness, tingling, tremors, sensory change, speech change, focal weakness, seizures, loss of consciousness, weakness and headaches.  Endo/Heme/Allergies:  Negative for environmental allergies. Does not bruise/bleed easily.  Psychiatric/Behavioral:  Negative for depression, hallucinations, memory loss, substance abuse and suicidal ideas. The patient is not nervous/anxious and does not have insomnia.      Physical Exam:  BP 115/65   Pulse 76   Temp 97.8 F (36.6 C) (Oral)   Ht 5\' 3"  (1.6 m)   Wt 68 kg   LMP 07/15/2021 (Exact Date)   BMI 26.56 kg/m   Constitutional: Well nourished, well developed female in no acute distress.  HEENT: normal Skin: Warm and dry.  Cardiovascular: Regular rate and rhythm.   Extremity:  no edema   Respiratory: Clear to auscultation bilateral. Normal respiratory effort Abdomen: FHT present Psych: Alert and Oriented x3. No memory deficits. Normal mood and affect.    Pelvic exam: per RN H. Cahoon 4/90/-1   Baseline FHR: 130 beats/min   Variability: moderate   Accelerations: present   Decelerations: present/variable Contractions: present frequency: every 2-5 Overall assessment: reassuring   No results found for: "SARSCOV2NAA"]  Assessment:  Briana Robinson is a 22 y.o. G50P0000 female at [redacted]w[redacted]d with  active labor.   Plan:  Admit to Labor & Delivery  CBC, T&S, Clrs, IVF  GBS negative.   Fetal well-being: Category I Expectant management for vaginal delivery    Tresea Mall, CNM 05/05/2022 12:58 AM

## 2022-05-06 DIAGNOSIS — D62 Acute posthemorrhagic anemia: Secondary | ICD-10-CM

## 2022-05-06 DIAGNOSIS — Z3A40 40 weeks gestation of pregnancy: Secondary | ICD-10-CM

## 2022-05-06 DIAGNOSIS — O480 Post-term pregnancy: Secondary | ICD-10-CM

## 2022-05-06 DIAGNOSIS — O9903 Anemia complicating the puerperium: Secondary | ICD-10-CM

## 2022-05-06 LAB — CBC
HCT: 30.5 % — ABNORMAL LOW (ref 36.0–46.0)
Hemoglobin: 9.7 g/dL — ABNORMAL LOW (ref 12.0–15.0)
MCH: 24.8 pg — ABNORMAL LOW (ref 26.0–34.0)
MCHC: 31.8 g/dL (ref 30.0–36.0)
MCV: 78 fL — ABNORMAL LOW (ref 80.0–100.0)
Platelets: 276 10*3/uL (ref 150–400)
RBC: 3.91 MIL/uL (ref 3.87–5.11)
RDW: 15.4 % (ref 11.5–15.5)
WBC: 13.8 10*3/uL — ABNORMAL HIGH (ref 4.0–10.5)
nRBC: 0 % (ref 0.0–0.2)

## 2022-05-06 NOTE — Anesthesia Postprocedure Evaluation (Signed)
Anesthesia Post Note  Patient: Briana Robinson  Procedure(s) Performed: AN AD HOC LABOR EPIDURAL  Patient location during evaluation: Mother Baby Anesthesia Type: Epidural Level of consciousness: awake and alert Pain management: pain level controlled Vital Signs Assessment: post-procedure vital signs reviewed and stable Respiratory status: spontaneous breathing, nonlabored ventilation and respiratory function stable Cardiovascular status: stable Postop Assessment: no headache, no backache and epidural receding Anesthetic complications: no   No notable events documented.   Last Vitals:  Vitals:   05/06/22 0301 05/06/22 0735  BP: 122/72 115/82  Pulse: 84 74  Resp: 20 18  Temp: 36.6 C 36.7 C  SpO2: 100% 100%    Last Pain:  Vitals:   05/06/22 0750  TempSrc:   PainSc: 0-No pain                 Corinda Gubler

## 2022-05-06 NOTE — Discharge Instructions (Signed)

## 2022-05-06 NOTE — Progress Notes (Signed)
Subjective:   Briana Robinson had a NSVB on 05/05/2022. Her labor was uncomplicated. Has had routine postpartum care.  Pt. Is eating, hydrating, and voiding regularly without difficulty. Has had a BM. She is exclusively breastfeeding. Reports small amount of vaginal bleeding, denies passing large blood clots. Has had cramping abdomen pain relieved with ibuprofen alone. Plans to use condoms for contraception. Denies anxiety/depression symptoms. Endorses good support from partner and family.   Baby was placed on phototherapy today and family plans to stay until tomorrow.  Objective:  Vital signs in last 24 hours: Temp:  [97.8 F (36.6 C)-98.3 F (36.8 C)] 98.1 F (36.7 C) (04/28 0735) Pulse Rate:  [71-88] 74 (04/28 0735) Resp:  [18-20] 18 (04/28 0735) BP: (114-137)/(72-82) 115/82 (04/28 0735) SpO2:  [99 %-100 %] 100 % (04/28 0735)    General: NAD Pulmonary: no increased work of breathing Breasts: soft, non-tender, nipples without breakdown Abdomen: soft, non-tender Fundus: firm, midline, at umbilicus Lochia: light rubra, no clots Perineum: no erythema or foul odor discharge, minimal edema, laceration well approximated  Extremities: no edema, no erythema, no tenderness  Results for orders placed or performed during the hospital encounter of 05/04/22 (from the past 72 hour(s))  CBC     Status: Abnormal   Collection Time: 05/04/22 10:50 PM  Result Value Ref Range   WBC 10.5 4.0 - 10.5 K/uL   RBC 4.78 3.87 - 5.11 MIL/uL   Hemoglobin 11.9 (L) 12.0 - 15.0 g/dL   HCT 16.1 09.6 - 04.5 %   MCV 77.4 (L) 80.0 - 100.0 fL   MCH 24.9 (L) 26.0 - 34.0 pg   MCHC 32.2 30.0 - 36.0 g/dL   RDW 40.9 81.1 - 91.4 %   Platelets 329 150 - 400 K/uL   nRBC 0.0 0.0 - 0.2 %    Comment: Performed at [facility], [address] [place], [place] [address]  RPR     Status: None   Collection Time: 05/04/22 10:50 PM  Result Value Ref Range   RPR Ser Ql NON REACTIVE NON REACTIVE    Comment:  Performed at [facility], [address], [place], [place] [address]  Type and screen     Status: None   Collection Time: 05/04/22 11:20 PM  Result Value Ref Range   ABO/RH(D) O POS    Antibody Screen NEG    Sample Expiration      05/07/2022,2359 Performed at [facility], [address] [place], [place] [address]   ABO/Rh     Status: None   Collection Time: 05/04/22 11:23 PM  Result Value Ref Range   ABO/RH(D)      O POS Performed at [facility], [address] [place], [place] [address]   CBC     Status: Abnormal   Collection Time: 05/06/22  6:32 AM  Result Value Ref Range   WBC 13.8 (H) 4.0 - 10.5 K/uL   RBC 3.91 3.87 - 5.11 MIL/uL   Hemoglobin 9.7 (L) 12.0 - 15.0 g/dL   HCT 95.2 (L) 84.1 - 32.4 %   MCV 78.0 (L) 80.0 - 100.0 fL   MCH 24.8 (L) 26.0 - 34.0 pg   MCHC 31.8 30.0 - 36.0 g/dL   RDW 40.1 02.7 - 25.3 %   Platelets 276 150 - 400 K/uL   nRBC 0.0 0.0 - 0.2 %    Comment: Performed at [facility], [address], [place], [place] [address]  Assessment:   22 y.o. G1P1001 1 day(s)  s/p NSVB Breastfeeding Anemia secondary to acute blood loss- hemodynamically stable and symptomatic VSS Pain well controlled  Plan:    PO Fe Blood Type --/--/O POS Performed at [facility], [address] [place], [place] [address]  (04/26 2323) / Rubella 6.46 (10/10 1637) / Varicella Unknown Rhogam not indicated Tdap/varicella/rubella to be offered before discharge if indicated Feeding plan breast, lactation support Encouraged to continue breastfeeding, BF education on latch, position changes, cluster feeding, hunger cues, lactogenesis II, milk supply Education given regarding options for contraception, as well as compatibility with breast feeding if applicable.  Patient plans on condoms for contraception. Continued routine postpartum care  Counseled on normal uterine involution and vaginal bleeding postpartum Anticipate  discharge home tomorrow    Raeford Razor, FNP, CNM [facility] 05/06/2022, 2:51 PM

## 2022-05-06 NOTE — Lactation Note (Signed)
This note was copied from a baby's chart. Lactation Consultation Note  Patient Name: Briana Robinson UUVOZ'D Date: 05/06/2022 Age:22 hours Reason for consult: Initial assessment;Primapara;Term;1st time breastfeeding   Maternal Data  Mother of baby is a G1P1001 parent at [redacted]w[redacted]d with infant 67 hours old. Mom has never breast fed before. Mom does not have a significant past medical history.   LC entered the room and infant was rooting and starting to get a little fussy. LC encouraged [name] to initiate STS with infant until she calms down and then attempt to put infant to the breast. Infant was put to the right breast in the football hold position. Infant was sleepy and not interested in feeding at this time even with stimulation and un swaddling. Mom stated that infant last feeding at was at 9:30 AM. LC reviewed infant hunger/feeding cues and signs of fullness. Reviewed normal newborn behaviors and output expectations on Day 1 and Day 2.Discussed cluster feeding after 24 hrs and the importance of adequate rest, nutrition and hydration.    LC encouraged lactating parent to contact Lactation for the next feeding and to reach out for any questions or concerns. [name] verbalized understanding.   Has patient been taught Hand Expression?: Yes Does the patient have breastfeeding experience prior to this delivery?: No  Feeding  LC did not observe a feeding at this time.  Mother's Current Feeding Choice: Breast Milk    Interventions Interventions: Breast feeding basics reviewed;Skin to skin;Hand express;Education   Consult Status Consult Status: Follow-up Follow-up type: In-patient  Care Nurse Updated.  Jon Gills 05/06/2022, 11:11 AM

## 2022-05-06 NOTE — Lactation Note (Signed)
This note was copied from a baby's chart. Lactation Consultation Note  Patient Name: Briana Robinson RUEAV'W Date: 05/06/2022 Age:22 hours Reason for consult: Follow-up assessment;Term;Primapara;Hyperbilirubinemia   Maternal Data This is mom's 1st baby, SVD.  Per chart review mom with no pertinent medical history documented.  On follow-up baby is receiving phototherapy treatment for hyperbilirubinemia.Assisted mom with breastfeeding. Initiated lactation feeding plan for sleepy baby and ineffective breastfeeding. Has patient been taught Hand Expression?: Yes Does the patient have breastfeeding experience prior to this delivery?: No  Feeding Mother's Current Feeding Choice: Breast Milk Provided mom tips and strategies on position and latch technique . Assisted mom with transfer of baby from crib with biliblanket. Mom positioned baby well in football hold with some assistance to optimize positioning with the blanket. Baby would not latch. Mom readily hand expressed some colostrum to encourage baby without latch/feeding achieved. Initiated mom pumping. Mom pumped 15 ml's. Mom chose to provide colostrum with slow flow bottle nipple. Mom has been expresssing milk every other day for past 2 weeks prior to delivery using a haaka pump.Mom has stored breastmilk in her freezer.Grandmother will bring to the hospital . Per mom if pumping and her prepumped milk being brought from home are not enough supplementation she prefers to use formula if needed and not donor milk. Baby bottle fed 13 ml's of breastmilk with paced bottle feeding technique in 15 minutes. LATCH Score Latch: Too sleepy or reluctant, no latch achieved, no sucking elicited.  Audible Swallowing: None  Type of Nipple: Everted at rest and after stimulation  Comfort (Breast/Nipple): Soft / non-tender  Hold (Positioning): Assistance needed to correctly position infant at breast and maintain latch.  LATCH Score: 5   Lactation Tools  Discussed/Used Tools: Bottle;Pump (Mom chose slow flow bottle to provide colostrom.) Breast pump type: Double-Electric Breast Pump Pump Education: Setup, frequency, and cleaning;Milk Storage Reason for Pumping: Baby slleepy at the breast receiving phototherapy treatment for hyperbilirubinemia. Pumping frequency: After breastfeeding attempts.if baby does not latch and effectively feed 15-20 minutes with swallows. Pumped volume: 15 mL  Interventions Interventions: DEBP;Hand express;Adjust position;Support pillows;Pace feeding Feeding plan: -Mom will offer baby to breastfeed at least every 3 hours or sooner with cues. -If baby will not latch, falls asleep at the breast despite techniques(expressing drops of colostrum on baby's lips, feeding baby in diaper and unswaddled, tactile stimulation) then mom will stop breastfeeding attempt and pump and provide her breastmilk. -Baby will be provided any expressed breastmilk and/or formula supplement  if baby will not breastfeed or did not complete a feeding at the breast. -Mom will provide 10-20 ml's 24-48 hours of life, 20-30 ml's 48-72 hours of life and after 72 hours of life a minimum of 30 ml's. -When mom is breast/bottle feeding baby will be placed on the biliblanket during the feeding. After the feeding is completed baby with be placed back under the bili lights with the biliblanket. Mom verbalized understanding and is agreement with the feeding plan. Discharge Pump: Personal (Mom has ben using a haaka pump every other day for 2 weeks. Mom has milk stored in the freezer at home and will have grandmother bring it to the hospital to provide supplementation to baby under double photo therapy treatment as needed.)  Consult Status Consult Status: Follow-up Date: 05/07/22 Follow-up type: In-patient  Update provided to care nurse and NP [place].  Fuller Song 05/06/2022, 2:45 PM

## 2022-05-07 ENCOUNTER — Encounter: Payer: Self-pay | Admitting: Obstetrics and Gynecology

## 2022-05-07 DIAGNOSIS — Z3A40 40 weeks gestation of pregnancy: Secondary | ICD-10-CM

## 2022-05-07 DIAGNOSIS — D62 Acute posthemorrhagic anemia: Secondary | ICD-10-CM

## 2022-05-07 DIAGNOSIS — O9903 Anemia complicating the puerperium: Secondary | ICD-10-CM

## 2022-05-07 DIAGNOSIS — O480 Post-term pregnancy: Secondary | ICD-10-CM

## 2022-05-07 MED ORDER — IBUPROFEN 600 MG PO TABS: 600.0000 mg | ORAL_TABLET | Freq: Four times a day (QID) | ORAL | 0 refills | Status: AC

## 2022-05-07 NOTE — Discharge Summary (Signed)
Postpartum Discharge Summary  Date of Service updated 05/07/2022     Patient Name: Briana Robinson DOB: 27-Jan-2000 MRN: [id number]  Date of admission: 05/04/2022 Delivery date:05/05/2022  Delivering provider: Doreene Burke  Date of discharge: 05/07/2022  Admitting diagnosis: Post-dates pregnancy [O48.0] Intrauterine pregnancy: [redacted]w[redacted]d     Secondary diagnosis:  Principal Problem:   Post-dates pregnancy Active Problems:   Postpartum care following vaginal delivery  Additional problems: none    Discharge diagnosis: Term Pregnancy Delivered                                              Post partum procedures: none Augmentation: AROM Complications: None  Hospital course: Onset of Labor With Vaginal Delivery      22 y.o. yo G1P1001 at [redacted]w[redacted]d was admitted in Active Labor on 05/04/2022. Labor course was complicated bynothing  Membrane Rupture Time/Date: 1:15 AM ,05/05/2022   Delivery Method:Vaginal, Spontaneous  Episiotomy: None  Lacerations:  None  Patient had a postpartum course complicated by nothing.  She is ambulating, tolerating a regular diet, passing flatus, and urinating well. Patient is discharged home in stable condition on 05/07/22.  Newborn Data: Birth date:05/05/2022  Birth time:2:29 PM  Gender:Female  Living status:Living  Apgars:8 ,8  Weight:2900 g   Magnesium Sulfate received: No BMZ received: No Rhophylac:N/A MMR:No T-DaP:Given prenatally Flu: No Transfusion:No  Physical exam  Vitals:   05/07/22 0010 05/07/22 0800 05/07/22 0852 05/07/22 1511  BP: 115/79 117/81 123/76 117/88  Pulse: 76 76 91 86  Resp:  18 17 18   Temp: 98.4 F (36.9 C) 98.3 F (36.8 C) 98.3 F (36.8 C) 98.4 F (36.9 C)  TempSrc: Oral Oral Oral Oral  SpO2: 99% 100% 99% 100%  Weight:      Height:       General: alert, cooperative, and no distress Lochia: appropriate Uterine Fundus: firm Incision: N/A DVT Evaluation: No evidence of DVT seen on physical exam. Negative Homan's  sign. No cords or calf tenderness. Labs: Lab Results  Component Value Date   WBC 13.8 (H) 05/06/2022   HGB 9.7 (L) 05/06/2022   HCT 30.5 (L) 05/06/2022   MCV 78.0 (L) 05/06/2022   PLT 276 05/06/2022      Latest Ref Rng & Units 08/30/2018   12:18 AM  CMP  Glucose 70 - 99 mg/dL 409   BUN 6 - 20 mg/dL 12   Creatinine 8.11 - 1.00 mg/dL 9.14   Sodium 782 - 956 mmol/L 137   Potassium 3.5 - 5.1 mmol/L 3.6   Chloride 98 - 111 mmol/L 106   CO2 22 - 32 mmol/L 23   Calcium 8.9 - 10.3 mg/dL 9.2    Edinburgh Score:    05/06/2022    3:04 AM  Edinburgh Postnatal Depression Scale Screening Tool  I have been able to laugh and see the funny side of things. 0  I have looked forward with enjoyment to things. 0  I have blamed myself unnecessarily when things went wrong. 0  I have been anxious or worried for no good reason. 1  I have felt scared or panicky for no good reason. 1  Things have been getting on top of me. 1  I have been so unhappy that I have had difficulty sleeping. 0  I have felt sad or miserable. 1  I have been so unhappy that  I have been crying. 0  The thought of harming myself has occurred to me. 0  Edinburgh Postnatal Depression Scale Total 4      After visit meds:  Allergies as of 05/07/2022   No Known Allergies      Medication List     TAKE these medications    ibuprofen 600 MG tablet Commonly known as: ADVIL Take 1 tablet (600 mg total) by mouth every 6 (six) hours.   prenatal multivitamin Tabs tablet Take 1 tablet by mouth daily at 12 noon.         Discharge home in stable condition Infant Feeding: Bottle Infant Disposition: under bili lights until her bili is WNL Discharge instruction: per After Visit Summary and Postpartum booklet. Activity: Advance as tolerated. Pelvic rest for 6 weeks.  Diet: routine diet Anticipated Birth Control: Condoms Postpartum Appointment:6 weeks Additional Postpartum F/U: Postpartum Depression checkup Future  Appointments:No future appointments. Follow up Visit:  Follow-up Information     [facility]. Schedule an appointment as soon as possible for a visit in 6 week(s).   Contact information: [address] [place] [address]        Doreene Burke, CNM. Schedule an appointment as soon as possible for a visit in 6 week(s).   Specialties: Certified Nurse Midwife, Radiology Contact information: [address] [place] [address] [phone]                     05/07/2022 Mirna Mires, CNM

## 2022-05-07 NOTE — Progress Notes (Signed)
Discharge instructions given and reviewed with pt and family. Pt educated on follow up care, appointments and when to notify provider, questions invited and answered.   Pt and family noted understanding  to teaching/education.Mother discharged as a pt from Unit  but remains in room as boarding with infant , infant still admitted as patient due to elevated rebound jaundice level. Mom and father  and infant id bands matched and intact. No distress noted. Parents educated that one person MUST stay with infant at all times who has an ID band that matches infant. Parents noted understanding to education.

## 2022-05-07 NOTE — Lactation Note (Signed)
This note was copied from a baby's chart. Lactation Consultation Note  Patient Name: Briana Robinson [id number] Date: 05/07/2022 Age:21 hours Reason for consult: Follow-up assessment;Primapara;Term;Hyperbilirubinemia  Hyperbilirubinemia; phototherapy discontinued this morning and serum levels within acceptable ranges.   Maternal Data Has patient been taught Hand Expression?: Yes Does the patient have breastfeeding experience prior to this delivery?: No  Feeding Mother's Current Feeding Choice: Breast Milk Nipple Type: Slow - flow  Mom started with breastfeeding and then pumping and formula was implemented due to elevated jaundice levels. Mom actively bottle feeding upon entry. Mom notes that bottle feeding provides more of a "schedule" and ability to track baby's intake.  Mom was pre-pumping prior to delivery and had colostrum available, and experience with pumping. She voices that she may lean more towards pumping and bottle feeding.  LATCH Score  Lactation Tools Discussed/Used Tools: Pump;Bottle Breast pump type: Double-Electric Breast Pump Reason for Pumping: hyperbilirubinemia  Interventions Interventions: Breast feeding basics reviewed;Hand express;DEBP;Ice;Education;Pace feeding  We reviewed the importance of breast stimulation and milk removal during the first 2 weeks. Education given on normal course of lactation and milk supply and demand. Encouraged frequent milk removal of every 2-3 hours.   Discharge Discharge Education: Engorgement and breast care;Warning signs for feeding baby;Outpatient recommendation Pump: Personal  Reviewed the potential for breast changes and management of breast fullness and engorgement and nipple care with breastfeeding and/or pumping.   Consult Status Consult Status: Complete  Outpatient lactation number provided. Encouraged to call with questions and ongoing BF support as needed.  Danford Bad 05/07/2022, 9:58 AM

## 2022-05-14 ENCOUNTER — Telehealth: Payer: Self-pay

## 2022-05-14 NOTE — Telephone Encounter (Signed)
Patient called for PP appointment. Discharge summary states patient needs 6 week PP appointment. Patient denies concerns at this time. Patient had SVD on 05/05/22 and thinks she was told to be seen 2 weeks after delivery but doesn't know why.  Patient counseled to call back if she has concerns that need to be addressed before PP appointment on 06/13/22.Marland KitchenBurt Knack, RN

## 2022-06-13 ENCOUNTER — Ambulatory Visit: Payer: Medicaid Other

## 2022-07-16 ENCOUNTER — Ambulatory Visit: Payer: Medicaid Other

## 2022-07-16 ENCOUNTER — Telehealth: Payer: Self-pay

## 2022-07-16 NOTE — Telephone Encounter (Signed)
[facility] for post-partum exam 07/16/22. Call to client to reschedule appt and per recorded message, number is not available. Call to mother (emergency contact) and left message requesting assistance contacting client to reschedule appt and number to call provided. Jossie Ng, RN

## 2022-07-18 ENCOUNTER — Telehealth: Payer: Self-pay

## 2022-07-18 NOTE — Telephone Encounter (Signed)
Call to client and post-partum appt scheduled for 08/03/2022 with arrival time of 1345. Per client, does not want an IUD or Nexplanon for birth control. Jossie Ng, RN

## 2022-07-18 NOTE — Telephone Encounter (Signed)
Client's post-partum appt is scheduled for 08/01/22 and not 08/03/22. Client aware of correct appt date / time. Jossie Ng, RN

## 2022-08-01 ENCOUNTER — Encounter: Payer: Self-pay | Admitting: Advanced Practice Midwife

## 2022-08-01 ENCOUNTER — Ambulatory Visit: Payer: Medicaid Other | Admitting: Advanced Practice Midwife

## 2022-08-01 DIAGNOSIS — F17200 Nicotine dependence, unspecified, uncomplicated: Secondary | ICD-10-CM | POA: Diagnosis not present

## 2022-08-01 DIAGNOSIS — Z309 Encounter for contraceptive management, unspecified: Secondary | ICD-10-CM | POA: Diagnosis not present

## 2022-08-01 DIAGNOSIS — Z30011 Encounter for initial prescription of contraceptive pills: Secondary | ICD-10-CM | POA: Diagnosis not present

## 2022-08-01 DIAGNOSIS — A749 Chlamydial infection, unspecified: Secondary | ICD-10-CM

## 2022-08-01 LAB — WET PREP FOR TRICH, YEAST, CLUE
Trichomonas Exam: NEGATIVE
Yeast Exam: NEGATIVE

## 2022-08-01 LAB — HEMOGLOBIN, FINGERSTICK: Hemoglobin: 11.6 g/dL (ref 11.1–15.9)

## 2022-08-01 MED ORDER — NORGESTIM-ETH ESTRAD TRIPHASIC 0.18/0.215/0.25 MG-25 MCG PO TABS
1.0000 | ORAL_TABLET | Freq: Every day | ORAL | 13 refills | Status: DC
Start: 2022-08-01 — End: 2022-11-09

## 2022-08-01 NOTE — Progress Notes (Signed)
In house labs reviewed during visit. BTHIELE RN

## 2022-08-01 NOTE — Progress Notes (Signed)
[facility] Department  Postpartum Exam  Briana Robinson is a 22 y.o.SBF  [id number]  female who presents for a postpartum visit. She is  12  weeks postpartum following a normal spontaneous vaginal delivery on 05/05/22 after AROM 6#6 F..  I have fully reviewed the prenatal and intrapartum course. The delivery was at 40 4/7 gestational weeks.  Anesthesia: epidural. Bottlefeeding q 4 hours (8 oz). Baby weighed 11 lbs on 07/20/22. Has had sex pp x1 on 07/28/22 with condom. LMP 07/16/22 x1 pp. FOB and her mom help with baby. No more lochia. Last vaped 2 wks ago. Denies cigs, cigars, MJ. Last ETOH 05/22/21 (1 cocktail).  Postpartum course has been wnl. Baby is doing well. Baby is feeding by bottle - Carnation Good Start. Bleeding no bleeding. Bowel function is normal. Bladder function is normal. Patient is sexually active. Contraception method is condoms. Postpartum depression screening: negative.   The pregnancy intention screening data noted above was reviewed. Potential methods of contraception were discussed. The patient elected to proceed with No data recorded.    Health Maintenance Due  Topic Date Due   HPV VACCINES (1 - 3-dose series) Never done   DTaP/Tdap/Td (2 - Td or Tdap) 08/28/2021   COVID-19 Vaccine (1 - 2023-24 season) Never done    The following portions of the patient's history were reviewed and updated as appropriate: allergies, current medications, past family history, past medical history, past social history, past surgical history, and problem list.  Review of Systems Pertinent items are noted in HPI.  Objective:  BP 100/61   Pulse 79   Temp 98.3 F (36.8 C)   Wt 133 lb 3.2 oz (60.4 kg)   LMP 07/16/2022 (Exact Date) Comment: bottle feeding  Breastfeeding Yes Comment: first month only  BMI 23.60 kg/m    General:  alert   Breasts:  normal  Lungs: clear to auscultation bilaterally  Heart:  regular rate and rhythm, S1, S2 normal, no murmur, click, rub or gallop   Abdomen: soft, non-tender; bowel sounds normal; no masses,  no organomegaly   Wound N/a  GU exam:  normal       Assessment:    1. Postpartum care following vaginal delivery Treat wet mount per standing orders Immunization nurse consult  2. Postpartum exam 12 wk pp exam Last pap 10/17/21 neg  3. Encounter for initial prescription of contraceptive pills E-Rx Tri Lo Sprintec #13 I po daily to begin today Pt counseled abstinance for next 7 days  4. Chlamydia infection affecting pregnancy, antepartum 04/04/22 treated 04/10/22 3 mo test for reinfection today   12 wk postpartum exam.   Plan:   Essential components of care per ACOG recommendations:  1.  Mood and well being: Patient with negative depression screening today. Reviewed local resources for support.  - Patient tobacco use? Yes. Patient desires to quit? No.   - hx of drug use? No.    2. Infant care and feeding:  -Patient currently breastmilk feeding? No.  -Social determinants of health (SDOH) reviewed in EPIC. No concernsThe following needs were identified  3. Sexuality, contraception and birth spacing - Patient does not want a pregnancy in the next year.  Desired family size is 1 children.  - Reviewed reproductive life planning. Reviewed options based on patient desire and reproductive life plan. Patient is interested in Oral Contraceptive. This was provided to the patient today.  if not why not clearly documented  Risks, benefits, and typical effectiveness rates were reviewed.  Questions were answered.  Written information was also given to the patient to review.    The patient will follow up in  1 years for surveillance.  The patient was told to call with any further questions, or with any concerns about this method of contraception.  Emphasized use of condoms 100% of the time for STI prevention.  Patient was offered ECP based on Unprotected sex within past 120 hours.  Patient is within 4 days of unprotected sex.  Patient was offered ECP. Reviewed options and patient desired No method of ECP, declined all    - Discussed birth spacing of 18 months  4. Sleep and fatigue -Encouraged family/partner/community support of 4 hrs of uninterrupted sleep to help with mood and fatigue  5. Physical Recovery  - Discussed patients delivery and complications. She describes her labor as mixed. - Patient had a Vaginal, no problems at delivery. Patient had a  0  laceration. Perineal healing reviewed. Patient expressed understanding - Patient has urinary incontinence? No. - Patient is not safe to resume physical and sexual activity  6.  Health Maintenance - HM due items addressed Yes - Last pap smear No results found for: "DIAGPAP" Pap smear not done at today's visit.  -Breast Cancer screening indicated? No.   7. Chronic Disease/Pregnancy Condition follow up:  none  - PCP follow up  Alberteen Spindle, CNM [facility] Department

## 2022-09-06 ENCOUNTER — Ambulatory Visit: Payer: Medicaid Other

## 2022-10-10 ENCOUNTER — Ambulatory Visit: Payer: Medicaid Other

## 2022-10-24 ENCOUNTER — Emergency Department
Admission: EM | Admit: 2022-10-24 | Discharge: 2022-10-24 | Disposition: A | Discharge status: Home / Self Care | Payer: Medicaid Other | Attending: Emergency Medicine | Admitting: Emergency Medicine

## 2022-10-24 ENCOUNTER — Other Ambulatory Visit: Payer: Self-pay

## 2022-10-24 DIAGNOSIS — R1030 Lower abdominal pain, unspecified: Secondary | ICD-10-CM | POA: Diagnosis present

## 2022-10-24 DIAGNOSIS — N390 Urinary tract infection, site not specified: Secondary | ICD-10-CM | POA: Insufficient documentation

## 2022-10-24 LAB — CBC
HCT: 40.1 % (ref 36.0–46.0)
Hemoglobin: 12.7 g/dL (ref 12.0–15.0)
MCH: 26.5 pg (ref 26.0–34.0)
MCHC: 31.7 g/dL (ref 30.0–36.0)
MCV: 83.5 fL (ref 80.0–100.0)
Platelets: 361 10*3/uL (ref 150–400)
RBC: 4.8 MIL/uL (ref 3.87–5.11)
RDW: 13.4 % (ref 11.5–15.5)
WBC: 7.2 10*3/uL (ref 4.0–10.5)
nRBC: 0 % (ref 0.0–0.2)

## 2022-10-24 LAB — COMPREHENSIVE METABOLIC PANEL
ALT: 11 U/L (ref 0–44)
AST: 13 U/L — ABNORMAL LOW (ref 15–41)
Albumin: 4.3 g/dL (ref 3.5–5.0)
Alkaline Phosphatase: 55 U/L (ref 38–126)
Anion gap: 9 (ref 5–15)
BUN: 12 mg/dL (ref 6–20)
CO2: 23 mmol/L (ref 22–32)
Calcium: 9.2 mg/dL (ref 8.9–10.3)
Chloride: 106 mmol/L (ref 98–111)
Creatinine, Ser: 0.67 mg/dL (ref 0.44–1.00)
GFR, Estimated: >60 mL/min (ref >60)
Glucose, Bld: 93 mg/dL (ref 70–99)
Potassium: 3.9 mmol/L (ref 3.5–5.1)
Sodium: 138 mmol/L (ref 135–145)
Total Bilirubin: 1.3 mg/dL — ABNORMAL HIGH (ref 0.3–1.2)
Total Protein: 8 g/dL (ref 6.5–8.1)

## 2022-10-24 LAB — PREGNANCY, URINE: Preg Test, Ur: NEGATIVE

## 2022-10-24 LAB — URINALYSIS, ROUTINE W REFLEX MICROSCOPIC
Bilirubin Urine: NEGATIVE
Glucose, UA: NEGATIVE mg/dL
Ketones, ur: NEGATIVE mg/dL
Nitrite: POSITIVE — AB
Protein, ur: 30 mg/dL — AB
RBC / HPF: >50 RBC/hpf (ref 0–5)
Specific Gravity, Urine: 1.013 (ref 1.005–1.030)
WBC, UA: >50 WBC/hpf (ref 0–5)
pH: 5 (ref 5.0–8.0)

## 2022-10-24 LAB — LIPASE, BLOOD: Lipase: 21 U/L (ref 11–51)

## 2022-10-24 MED ORDER — CEPHALEXIN 500 MG PO CAPS
500.0000 mg | ORAL_CAPSULE | Freq: Once | ORAL | Status: AC
  Administered 2022-10-24: 500 mg via ORAL
  Filled 2022-10-24: qty 1

## 2022-10-24 MED ORDER — CEPHALEXIN 500 MG PO CAPS: 500.0000 mg | ORAL_CAPSULE | Freq: Three times a day (TID) | ORAL | 0 refills | Status: AC

## 2022-10-24 NOTE — ED Provider Notes (Signed)
[facility] Provider Note    Event Date/Time   First MD Initiated Contact with Patient 10/24/22 1913     (approximate)   History   Abdominal Pain (X1 week)   HPI  Briana Robinson is a 22 y.o. female who presents with complaints of lower abdominal pain for approximately 1 week, she reports dysuria frequency and sensation of incomplete emptying.  No fevers chills or back pain.  No vaginal discharge     Physical Exam   Triage Vital Signs: ED Triage Vitals  Encounter Vitals Group     BP 10/24/22 1843 109/67     Systolic BP Percentile --      Diastolic BP Percentile --      Pulse Rate 10/24/22 1843 99     Resp 10/24/22 1843 17     Temp 10/24/22 1843 98.6 F (37 C)     Temp src --      SpO2 10/24/22 1843 100 %     Weight 10/24/22 1843 61.2 kg (135 lb)     Height 10/24/22 1843 1.6 m (5\' 3" )     Head Circumference --      Peak Flow --      Pain Score 10/24/22 1842 7     Pain Loc --      Pain Education --      Exclude from Growth Chart --     Most recent vital signs: Vitals:   10/24/22 1843  BP: 109/67  Pulse: 99  Resp: 17  Temp: 98.6 F (37 C)  SpO2: 100%     General: Awake, no distress.  CV:  Good peripheral perfusion.  Resp:  Normal effort.  Abd:  No distention.  Soft, nontender, reassuring exam, no CVA tenderness Other:     ED Results / Procedures / Treatments   Labs (all labs ordered are listed, but only abnormal results are displayed) Labs Reviewed  COMPREHENSIVE METABOLIC PANEL - Abnormal; Notable for the following components:      Result Value   AST 13 (*)    Total Bilirubin 1.3 (*)    All other components within normal limits  URINALYSIS, ROUTINE W REFLEX MICROSCOPIC - Abnormal; Notable for the following components:   Color, Urine YELLOW (*)    APPearance CLOUDY (*)    Hgb urine dipstick MODERATE (*)    Protein, ur 30 (*)    Nitrite POSITIVE (*)    Leukocytes,Ua LARGE (*)    Bacteria, UA FEW (*)    All other  components within normal limits  LIPASE, BLOOD  CBC  PREGNANCY, URINE     EKG     RADIOLOGY     PROCEDURES:  Critical Care performed:   Procedures   MEDICATIONS ORDERED IN ED: Medications  cephALEXin (KEFLEX) capsule 500 mg (500 mg Oral Given 10/24/22 1938)     IMPRESSION / MDM / ASSESSMENT AND PLAN / ED COURSE  I reviewed the triage vital signs and the nursing notes. Patient's presentation is most consistent with acute illness / injury with system symptoms.  Patient presents with symptoms consistent with UTI, urinalysis is consistent with UTI lab work reviewed and is overall quite reassuring.  P.o. Keflex given here in the emergency department, prescription provided, outpatient follow-up as needed, return precautions discussed.        FINAL CLINICAL IMPRESSION(S) / ED DIAGNOSES   Final diagnoses:  Lower urinary tract infectious disease     Rx / DC Orders   ED Discharge  Orders          Ordered    cephALEXin (KEFLEX) 500 MG capsule  3 times daily        10/24/22 1936             Note:  This document was prepared using Dragon voice recognition software and may include unintentional dictation errors.   Jene Every, MD 10/24/22 2130

## 2022-10-24 NOTE — ED Triage Notes (Signed)
Pt to ed from home via POV for abdominal pain x 1 week. Pt advised its a constant pain in lower abdomen. Pt last BM was this morning and was normal  . Pt is CAOx4, in no acute distress and ambulatory in triage.

## 2022-11-08 ENCOUNTER — Emergency Department
Admission: EM | Admit: 2022-11-08 | Discharge: 2022-11-09 | Disposition: A | Discharge status: Home / Self Care | Payer: Medicaid Other | Attending: Emergency Medicine | Admitting: Emergency Medicine

## 2022-11-08 ENCOUNTER — Other Ambulatory Visit: Payer: Self-pay

## 2022-11-08 DIAGNOSIS — R112 Nausea with vomiting, unspecified: Secondary | ICD-10-CM | POA: Insufficient documentation

## 2022-11-08 DIAGNOSIS — R5381 Other malaise: Secondary | ICD-10-CM | POA: Diagnosis not present

## 2022-11-08 DIAGNOSIS — N390 Urinary tract infection, site not specified: Secondary | ICD-10-CM | POA: Insufficient documentation

## 2022-11-08 DIAGNOSIS — R109 Unspecified abdominal pain: Secondary | ICD-10-CM | POA: Diagnosis present

## 2022-11-08 DIAGNOSIS — D72829 Elevated white blood cell count, unspecified: Secondary | ICD-10-CM | POA: Diagnosis not present

## 2022-11-08 LAB — URINALYSIS, ROUTINE W REFLEX MICROSCOPIC
Bilirubin Urine: NEGATIVE
Glucose, UA: NEGATIVE mg/dL
Hgb urine dipstick: NEGATIVE
Ketones, ur: 20 mg/dL — AB
Nitrite: NEGATIVE
Protein, ur: 30 mg/dL — AB
Specific Gravity, Urine: 1.023 (ref 1.005–1.030)
WBC, UA: >50 WBC/hpf (ref 0–5)
pH: 5 (ref 5.0–8.0)

## 2022-11-08 LAB — COMPREHENSIVE METABOLIC PANEL
ALT: 9 U/L (ref 0–44)
AST: 12 U/L — ABNORMAL LOW (ref 15–41)
Albumin: 3.9 g/dL (ref 3.5–5.0)
Alkaline Phosphatase: 50 U/L (ref 38–126)
Anion gap: 11 (ref 5–15)
BUN: 12 mg/dL (ref 6–20)
CO2: 25 mmol/L (ref 22–32)
Calcium: 9 mg/dL (ref 8.9–10.3)
Chloride: 97 mmol/L — ABNORMAL LOW (ref 98–111)
Creatinine, Ser: 0.65 mg/dL (ref 0.44–1.00)
GFR, Estimated: >60 mL/min (ref >60)
Glucose, Bld: 96 mg/dL (ref 70–99)
Potassium: 3.5 mmol/L (ref 3.5–5.1)
Sodium: 133 mmol/L — ABNORMAL LOW (ref 135–145)
Total Bilirubin: 1.2 mg/dL (ref 0.3–1.2)
Total Protein: 8.7 g/dL — ABNORMAL HIGH (ref 6.5–8.1)

## 2022-11-08 LAB — CBC
HCT: 39 % (ref 36.0–46.0)
Hemoglobin: 12.4 g/dL (ref 12.0–15.0)
MCH: 26.8 pg (ref 26.0–34.0)
MCHC: 31.8 g/dL (ref 30.0–36.0)
MCV: 84.2 fL (ref 80.0–100.0)
Platelets: 288 10*3/uL (ref 150–400)
RBC: 4.63 MIL/uL (ref 3.87–5.11)
RDW: 13.2 % (ref 11.5–15.5)
WBC: 12.8 10*3/uL — ABNORMAL HIGH (ref 4.0–10.5)
nRBC: 0 % (ref 0.0–0.2)

## 2022-11-08 LAB — POC URINE PREG, ED: Preg Test, Ur: NEGATIVE

## 2022-11-08 LAB — LIPASE, BLOOD: Lipase: 24 U/L (ref 11–51)

## 2022-11-08 NOTE — ED Triage Notes (Signed)
Pt to ED via POV c/o body aches, abd pain, vomiting x5 days. Pt complaining of LUQ abd pain. Pt endorses vomiting, denies diarrhea. Hasn't been able to eat much. Reports subjective fever. Has been taking theraflu

## 2022-11-09 ENCOUNTER — Ambulatory Visit: Payer: Medicaid Other | Admitting: Nurse Practitioner

## 2022-11-09 ENCOUNTER — Telehealth: Payer: Self-pay | Admitting: Emergency Medicine

## 2022-11-09 ENCOUNTER — Encounter: Payer: Self-pay | Admitting: Nurse Practitioner

## 2022-11-09 ENCOUNTER — Other Ambulatory Visit: Payer: Self-pay

## 2022-11-09 DIAGNOSIS — Z202 Contact with and (suspected) exposure to infections with a predominantly sexual mode of transmission: Secondary | ICD-10-CM

## 2022-11-09 DIAGNOSIS — Z113 Encounter for screening for infections with a predominantly sexual mode of transmission: Secondary | ICD-10-CM

## 2022-11-09 LAB — WET PREP FOR TRICH, YEAST, CLUE
Trichomonas Exam: NEGATIVE
Yeast Exam: NEGATIVE

## 2022-11-09 LAB — HM HIV SCREENING LAB: HM HIV Screening: NEGATIVE

## 2022-11-09 MED ORDER — ZITHROMAX 500 MG PO TABS: 1000.0000 mg | ORAL_TABLET | Freq: Once | ORAL | Status: AC

## 2022-11-09 MED ORDER — CEPHALEXIN 500 MG PO CAPS
500.0000 mg | ORAL_CAPSULE | Freq: Once | ORAL | Status: AC
  Administered 2022-11-09: 500 mg via ORAL
  Filled 2022-11-09: qty 1

## 2022-11-09 MED ORDER — CEPHALEXIN 500 MG PO CAPS: 500.0000 mg | ORAL_CAPSULE | Freq: Four times a day (QID) | ORAL | 0 refills | Status: DC

## 2022-11-09 MED ORDER — ONDANSETRON 4 MG PO TBDP: ORAL_TABLET | ORAL | 0 refills | Status: AC

## 2022-11-09 MED ORDER — ONDANSETRON 8 MG PO TBDP
8.0000 mg | ORAL_TABLET | Freq: Once | ORAL | Status: AC
  Administered 2022-11-09: 8 mg via ORAL
  Filled 2022-11-09: qty 2

## 2022-11-09 NOTE — Progress Notes (Unsigned)
Pt is here for STD screening.  Wet mount results reviewed by Provider.  Berdie Ogren, RN

## 2022-11-09 NOTE — Telephone Encounter (Signed)
As I was completing the documentation for this patient after she was discharged, I realized that I had not ordered a urine culture.  I contacted the lab and verified that her urine specimen was still in the lab, and I placed the appropriate order for urine culture.  They are adding it on now and it should show up in MyChart and for future review should adjustments need to be made for her antibiotic therapy.

## 2022-11-09 NOTE — Progress Notes (Unsigned)
[facility] Department  STI clinic/screening visit [address] [place] [address] [phone]  Subjective:  Briana Robinson is a pleasant 22 y.o. female being seen today for an STI screening visit. The patient reports they do have symptoms.  Patient reports that they do not desire a pregnancy in the next year.   They reported they are not interested in discussing contraception today, but would like information to review at home, this was given.    Patient's last menstrual period was 09/30/2022 (exact date).  Patient has the following medical conditions:   Patient Active Problem List   Diagnosis Date Noted   Chlamydia infection affecting pregnancy 04/04/22 04/10/2022   Urinary tract infection #2 11/14/21 Klebsiella pneumoniae 11/20/2021   UTI (urinary tract infection) dx'd 10/17/21 10/24/2021    Chief Complaint  Patient presents with   SEXUALLY TRANSMITTED DISEASE    Screening    Patient is a pleasant 22 y.o. female who presents to the clinic today requesting STI testing. Of note, she was seen in the ED last night for symptoms (flank pain, dysuria, urinary frequency, fever) diagnosed as UTI and prescribed cephalexin. Patient has not picked up prescription yet, but will after this appointment. She reports today that her partner had another partner that was positive for Chlamydia. She states her partner did not tell her and she does not know if that person was tested or treated.  Patient endorses abdominal pain for two weeks that is constant until she takes Melatonin and lays down to sleep. Reports pain right now is a 4 on the 0-10 pain scale but has been as high or higher than a 10 on the same scale. She has tried hot showers, massages, and teas with minimal relief. She reports increased vaginal discharge that is yellow in color with a mucous consistency and non-odorous.  Patient indicates she has had dysuria for about 2 weeks with no frank blood in the toilet, but  has noticed blood on the paper when she wipes.  She also endorses pain everywhere when she has sex.  Has had a sore throat for 2 days and feels her vision is more clear seeing brighter more saturated colors.  Of note, after chart review, patient was seen in the ED on 10/24/22 and prescribed Cephalexin for UTI then. This was not seen until after patient left clinic and it is unclear if she picked up the prescription or took it. Also of note, the patient had Klebsiella pneumoniae in her urine on 11/14/21 found during prenatal testing. Also, patient has a history of chlamydia diagnosed 04/04/22 found on prenatal testing, with TOC negative on 05/01/22.  Patient reports 1 partner in the last 2 months, practices vaginal and oral sex, denies condom use and reports no contraceptive methods currently being used. Patient's LMP was 09/30/22. UPT in hospital was negative last night.    Does the patient using douching products? Not assessed.   Last HIV test per patient/review of record was  Lab Results  Component Value Date   HIV Non Reactive 10/17/2021    Patient reports last pap was  Lab Results  Component Value Date   SPECADGYN Comment 10/17/2021    Screening for MPX risk: Does the patient have an unexplained rash? No Is the patient MSM? No Does the patient endorse multiple sex partners or anonymous sex partners? No Did the patient have close or sexual contact with a person diagnosed with MPX? No Has the patient traveled outside the [place] where  MPX is endemic? No Is there a high clinical suspicion for MPX-- evidenced by one of the following No  -Unlikely to be chickenpox  -Lymphadenopathy  -Rash that present in same phase of evolution on any given body part See flowsheet for further details and programmatic requirements.   Immunization history:  Immunization History  Administered Date(s) Administered   Hepatitis B 2000[birth year]03-[birth year], 08/07/2000, 03/25/2001   MMR 05/28/2001, 09/06/2005   Pneumococcal  Conjugate PCV 7 08/07/2000, 11/27/2000, 03/25/2001   Tdap 08/29/2011   Varicella 05/28/2001, 09/06/2005     The following portions of the patient's history were reviewed and updated as appropriate: allergies, current medications, past medical history, past social history, past surgical history and problem list.  Objective:  There were no vitals filed for this visit.  Physical Exam Nursing note reviewed. Exam conducted with a chaperone present Justice Rocher, RN present as chaperone for PE.).  Constitutional:      General: She is not in acute distress.    Appearance: Normal appearance. She is not ill-appearing.  HENT:     Head: Normocephalic.     Mouth/Throat:     Lips: Pink. No lesions.     Mouth: Mucous membranes are moist.     Pharynx: Oropharynx is clear. No oropharyngeal exudate or posterior oropharyngeal erythema.     Tonsils: No tonsillar exudate.      Comments: Small tonsillolith present to left tonsil.  Eyes:     General:        Right eye: No discharge.        Left eye: No discharge.  Pulmonary:     Effort: Pulmonary effort is normal.  Genitourinary:    General: Normal vulva.     Exam position: Lithotomy position.     Pubic Area: No rash or pubic lice.      Tanner stage (genital): 5.     Labia:        Right: No rash, tenderness or lesion.        Left: No rash, tenderness or lesion.      Vagina: No signs of injury. Vaginal discharge present. No erythema, tenderness, bleeding or lesions.     Cervix: Discharge, erythema and eversion present. No cervical motion tenderness, friability, lesion or cervical bleeding.     Uterus: Normal.      Adnexa: Right adnexa normal and left adnexa normal.        Comments: pH = <4.5 Copious amounts of yellow vaginal discharge from cervical os and within the vaginal vault, non-odorous.  Possible cervical eversion around anterior and right lateral cervical os. Cervix does not have strawberry appearance and no CMT is present. No  friability present.  Lymphadenopathy:     Head:     Right side of head: No submental, submandibular, tonsillar, preauricular or posterior auricular adenopathy.     Left side of head: No submental, submandibular, tonsillar, preauricular or posterior auricular adenopathy.     Cervical: No cervical adenopathy.     Right cervical: No superficial or posterior cervical adenopathy.    Left cervical: No superficial or posterior cervical adenopathy.     Upper Body:     Right upper body: No supraclavicular or axillary adenopathy.     Left upper body: No supraclavicular or axillary adenopathy.     Lower Body: No right inguinal adenopathy. No left inguinal adenopathy.  Skin:    General: Skin is warm and dry.     Findings: No rash.     Comments: Exposed areas only.  Skin tone appropriate for ethnicity.   Neurological:     Mental Status: She is alert and oriented to person, place, and time.  Psychiatric:        Attention and Perception: Attention normal.        Mood and Affect: Mood and affect normal.        Speech: Speech normal.        Behavior: Behavior normal. Behavior is cooperative.        Thought Content: Thought content normal.     Assessment and Plan:  Briana Robinson is a 22 y.o. female presenting to the [facility] Department for STI screening  1. Screening for venereal disease Screening performed per protocol for symptomatic patient with partner possibly infected with Chlamydia. - Chlamydia/Gonorrhea [place] [facility] - HIV [place] [facility] - Syphilis Serology,  Lab - Gonococcus culture - WET PREP FOR TRICH, YEAST, CLUE  2. Chlamydia contact Patient presents with symptoms of STI and as a possible contact to Chlamydia. Treated with Azithromycin because patient could possibly be pregnant (Sex without condoms, LMP 09/30/22-over 1 month); however, UPT in ED yesterday negative.  - ZITHROMAX 500 MG tablet; Take 2 tablets (1,000 mg total) by mouth once for 1 dose.   Patient  accepted all screenings including oral, vaginal CT/GC and bloodwork for HIV/RPR, and wet prep. Patient meets criteria for HepB screening? No. Ordered? not applicable Patient meets criteria for HepC screening? No. Ordered? not applicable  Treat wet prep per standing order  Discussed time line for [facility] results and that patient will be called with positive results and encouraged patient to call if she had not heard in 2 weeks.   Counseled to return or seek care for continued or worsening symptoms.  Recommended repeat testing in 3 months with positive results. Recommended condom use with all sex.  Patient is currently using  no method  to prevent pregnancy. Patient given printed educational information to review regarding contraception. Patient advised she will return to clinic when she is ready to begin contraception. Patient encouraged to contact us with any questions. Patient not offered ECP as last intercourse was over 2 weeks ago putting her outside of the appropriate treatment timeframe.   Return if symptoms worsen or fail to improve.  Total time with patient 30 minutes.   No future appointments.  Edmonia James, NP

## 2022-11-09 NOTE — Discharge Instructions (Addendum)
Your workup today suggests that you have a urinary tract infection (UTI), which is likely causing all of your symptoms.  Please take your antibiotic as prescribed and over-the-counter pain medication (Tylenol or Motrin) as needed, but no more than recommended on the label instructions.  Drink PLENTY of fluids.  Call your regular doctor to schedule the next available appointment to follow up on today's ED visit, or return immediately to the ED if your pain worsens, you have decreased urine production, develop fever, persistent vomiting, or other symptoms that concern you.

## 2022-11-09 NOTE — ED Provider Notes (Signed)
[facility] Provider Note    Event Date/Time   First MD Initiated Contact with Patient 11/08/22 2353     (approximate)   History   Abdominal Pain and Generalized Body Aches   HPI Briana Robinson is a 22 y.o. female  who presents for evaluation of about 5 days of N/V, body aches, and general malaise.  Patient does not provide much information, just that she has not been feeling well as previously described.  Denies chest pain and SOB.  She has some abdominal pain, and it is mostly in her LUQ.  Denies lower abd/pelvic pain.  Some dysuria and increased urinary frequency.  No hematuria.  Denies sore throat.       Physical Exam   Triage Vital Signs: ED Triage Vitals  Encounter Vitals Group     BP 11/08/22 2124 91/63     Systolic BP Percentile --      Diastolic BP Percentile --      Pulse Rate 11/08/22 2124 (!) 102     Resp 11/08/22 2124 16     Temp 11/08/22 2124 99 F (37.2 C)     Temp Source 11/08/22 2124 Oral     SpO2 11/08/22 2124 100 %     Weight 11/08/22 2130 59 kg (130 lb)     Height --      Head Circumference --      Peak Flow --      Pain Score 11/08/22 2130 10     Pain Loc --      Pain Education --      Exclude from Growth Chart --     Most recent vital signs: Vitals:   11/09/22 0112 11/09/22 0138  BP: 104/75   Pulse: 84   Resp: 16   Temp:  100.1 F (37.8 C)  SpO2: 100%     General: Awake, no obvious distress.  Generally well-appearing. CV:  Good peripheral perfusion. Regular rate and rhythm. Resp:  Normal effort. Speaking easily and comfortably, no accessory muscle usage nor intercostal retractions.  Lungs CTAB. Abd:  No distention. No tenderness to palpation through the abdomen including LUQ and LLQ.  No rebound/guarding.   ED Results / Procedures / Treatments   Labs (all labs ordered are listed, but only abnormal results are displayed) Labs Reviewed  COMPREHENSIVE METABOLIC PANEL - Abnormal; Notable for the following  components:      Result Value   Sodium 133 (*)    Chloride 97 (*)    Total Protein 8.7 (*)    AST 12 (*)    All other components within normal limits  CBC - Abnormal; Notable for the following components:   WBC 12.8 (*)    All other components within normal limits  URINALYSIS, ROUTINE W REFLEX MICROSCOPIC - Abnormal; Notable for the following components:   Color, Urine YELLOW (*)    APPearance CLOUDY (*)    Ketones, ur 20 (*)    Protein, ur 30 (*)    Leukocytes,Ua LARGE (*)    Bacteria, UA MANY (*)    Non Squamous Epithelial PRESENT (*)    All other components within normal limits  LIPASE, BLOOD  POC URINE PREG, ED     EKG  ED ECG REPORT I, Loleta Rose, the attending physician, personally viewed and interpreted this ECG.  Date: 11/08/2022 EKG Time: 21:23 Rate: 105 Rhythm: sinus tachycardia QRS Axis: normal Intervals: normal ST/T Wave abnormalities: Non-specific ST segment / T-wave changes, but no  clear evidence of acute ischemia. Narrative Interpretation: no definitive evidence of acute ischemia; does not meet STEMI criteria.    PROCEDURES:  Critical Care performed: No  Procedures    IMPRESSION / MDM / ASSESSMENT AND PLAN / ED COURSE  I reviewed the triage vital signs and the nursing notes.                              Differential diagnosis includes, but is not limited to, viral illness, pneumonia, UTI.  Patient's presentation is most consistent with acute presentation with potential threat to life or bodily function.  Labs/studies ordered: Urine pregnancy test, urinalysis, CMP, CBC, lipase, EKG  Interventions/Medications given:  Medications  ondansetron (ZOFRAN-ODT) disintegrating tablet 8 mg (8 mg Oral Given 11/09/22 0011)  cephALEXin (KEFLEX) capsule 500 mg (500 mg Oral Given 11/09/22 0043)    (Note:  hospital course my include additional interventions and/or labs/studies not listed above.)   Vital signs are stable and within normal limits with  a very slightly elevated temperature of 100.1.  Her evaluation is reassuring with no tenderness to palpation at all of the abdomen and clear lung sounds with no respiratory symptoms.  She has a mild leukocytosis and a grossly positive urinalysis.  I suspect this is the source of her malaise and low-grade fever.  I am treating empirically as listed above and below and I talked with the patient and her family about this.  They agree with the plan.  I will treat her now with Zofran and a fluid challenge (oral rehydration or so).  If she can tolerate oral intake and not having any additional vomiting or abdominal pain/tenderness (she had none during my examination), I do not think she would benefit from imaging.     Clinical Course as of 11/09/22 4098  Caleen Essex Nov 09, 2022  0126 I reassessed the patient.  She is tolerating her Gatorade without difficulty, feels better after Zofran.  Not having pain.  Using her phone without any apparent discomfort or distress.  Comfortable with the plan for discharge and outpatient follow-up.  I gave my usual and customary return precautions. [CF]    Clinical Course User Index [CF] Loleta Rose, MD     FINAL CLINICAL IMPRESSION(S) / ED DIAGNOSES   Final diagnoses:  Urinary tract infection without hematuria, site unspecified  Nausea and vomiting, unspecified vomiting type     Rx / DC Orders   ED Discharge Orders          Ordered    Ambulatory Referral to Primary Care (Establish Care)        11/09/22 0126    cephALEXin (KEFLEX) 500 MG capsule  4 times daily        11/09/22 0127    ondansetron (ZOFRAN-ODT) 4 MG disintegrating tablet        11/09/22 0127             Note:  This document was prepared using Dragon voice recognition software and may include unintentional dictation errors.   Loleta Rose, MD 11/09/22 [phone]

## 2022-11-09 NOTE — ED Notes (Signed)
Patient given meds and provided with 1 gatorade for oral rehydration.

## 2022-11-09 NOTE — ED Notes (Signed)
Patient given discharge instructions including prescriptions x2 and importance of establishing PCP with stated understanding. Patient stable and ambulatory with steady even gait on dispo.

## 2022-11-10 LAB — URINE CULTURE: Culture: NO GROWTH

## 2022-11-12 NOTE — Progress Notes (Signed)

## 2022-11-14 LAB — GONOCOCCUS CULTURE

## 2023-01-31 ENCOUNTER — Telehealth: Payer: Self-pay

## 2023-01-31 ENCOUNTER — Ambulatory Visit: Payer: Medicaid Other | Admitting: Physician Assistant

## 2023-01-31 ENCOUNTER — Encounter: Payer: Self-pay | Admitting: Physician Assistant

## 2023-01-31 DIAGNOSIS — Z113 Encounter for screening for infections with a predominantly sexual mode of transmission: Secondary | ICD-10-CM | POA: Diagnosis not present

## 2023-01-31 DIAGNOSIS — Z5321 Procedure and treatment not carried out due to patient leaving prior to being seen by health care provider: Secondary | ICD-10-CM

## 2023-01-31 NOTE — Progress Notes (Signed)
Patient was sent to lab for bloodwork, did not go to lab and left prior to being seen by provider. [name] PA-C

## 2023-01-31 NOTE — Progress Notes (Signed)
Patient here for STI testing. Patient had PP exam 08/01/22 and was prescribed Tri Lo Sprintec x 13 packs. Patient states she never picked them up and would like to.Burt Knack, RN

## 2023-01-31 NOTE — Telephone Encounter (Signed)
TC to patient who was here for STI testing and left without being seen by provider. This RN checked the lab and waiting area. Patient room was empty in clinic. Left message with number to call if patient wants to reschedule. Burt Knack, RN

## 2023-04-08 ENCOUNTER — Ambulatory Visit

## 2023-06-13 ENCOUNTER — Ambulatory Visit

## 2023-10-11 ENCOUNTER — Other Ambulatory Visit: Payer: Self-pay

## 2023-10-11 ENCOUNTER — Emergency Department
Admission: EM | Admit: 2023-10-11 | Discharge: 2023-10-11 | Disposition: A | Discharge status: Home / Self Care | Payer: Self-pay | Attending: Emergency Medicine | Admitting: Emergency Medicine

## 2023-10-11 ENCOUNTER — Encounter: Payer: Self-pay | Admitting: Emergency Medicine

## 2023-10-11 DIAGNOSIS — J020 Streptococcal pharyngitis: Secondary | ICD-10-CM | POA: Insufficient documentation

## 2023-10-11 DIAGNOSIS — J029 Acute pharyngitis, unspecified: Secondary | ICD-10-CM

## 2023-10-11 DIAGNOSIS — R509 Fever, unspecified: Secondary | ICD-10-CM | POA: Insufficient documentation

## 2023-10-11 LAB — RESP PANEL BY RT-PCR (RSV, FLU A&B, COVID)  RVPGX2
Influenza A by PCR: NEGATIVE
Influenza B by PCR: NEGATIVE
Resp Syncytial Virus by PCR: NEGATIVE
SARS Coronavirus 2 by RT PCR: NEGATIVE

## 2023-10-11 LAB — GROUP A STREP BY PCR: Group A Strep by PCR: NOT DETECTED

## 2023-10-11 MED ORDER — CEPHALEXIN 500 MG PO CAPS
500.0000 mg | ORAL_CAPSULE | Freq: Once | ORAL | Status: AC
  Administered 2023-10-11: 500 mg via ORAL
  Filled 2023-10-11: qty 1

## 2023-10-11 MED ORDER — SODIUM CHLORIDE 0.9 % IV BOLUS
1000.0000 mL | Freq: Once | INTRAVENOUS | Status: AC
  Administered 2023-10-11: 1000 mL via INTRAVENOUS

## 2023-10-11 MED ORDER — KETOROLAC TROMETHAMINE 30 MG/ML IJ SOLN
30.0000 mg | Freq: Once | INTRAMUSCULAR | Status: AC
  Administered 2023-10-11: 30 mg via INTRAVENOUS
  Filled 2023-10-11: qty 1

## 2023-10-11 MED ORDER — ONDANSETRON HCL 4 MG/2ML IJ SOLN
4.0000 mg | Freq: Once | INTRAMUSCULAR | Status: AC
  Administered 2023-10-11: 4 mg via INTRAVENOUS
  Filled 2023-10-11: qty 2

## 2023-10-11 MED ORDER — CEPHALEXIN 500 MG PO CAPS: 500.0000 mg | ORAL_CAPSULE | Freq: Three times a day (TID) | ORAL | 0 refills | Status: DC

## 2023-10-11 NOTE — ED Provider Notes (Signed)
 [facility] Provider Note    Event Date/Time   First MD Initiated Contact with Patient 10/11/23 1730     (approximate)  History   Chief Complaint: Generalized Body Aches and Nausea  HPI  BARBA SOLT is a 23 y.o. female with no significant past medical history who presents to the emergency department for fever body aches weakness.  According to the patient since yesterday she has been feeling very weak today states she has had very bad body aches subjective fever at home.  Patient denies any significant cough states slight congestion.  Patient does state a sore throat since yesterday as well.  No vomiting no chest pain or abdominal pain.  Physical Exam   Triage Vital Signs: ED Triage Vitals  Encounter Vitals Group     BP 10/11/23 1633 119/76     Girls Systolic BP Percentile --      Girls Diastolic BP Percentile --      Boys Systolic BP Percentile --      Boys Diastolic BP Percentile --      Pulse Rate 10/11/23 1633 92     Resp 10/11/23 1633 16     Temp 10/11/23 1633 99 F (37.2 C)     Temp Source 10/11/23 1633 Oral     SpO2 10/11/23 1633 100 %     Weight 10/11/23 1633 130 lb (59 kg)     Height 10/11/23 1633 5' 3 (1.6 m)     Head Circumference --      Peak Flow --      Pain Score 10/11/23 1632 10     Pain Loc --      Pain Education --      Exclude from Growth Chart --     Most recent vital signs: Vitals:   10/11/23 1633  BP: 119/76  Pulse: 92  Resp: 16  Temp: 99 F (37.2 C)  SpO2: 100%    General: Awake, no distress.  CV:  Good peripheral perfusion.  Regular rate and rhythm  Resp:  Normal effort.  Equal breath sounds bilaterally.  Abd:  No distention.  Soft, nontender Other:  Patient has moderate pharyngeal erythema with moderate tonsillar hypertrophy bilaterally with tonsillar exudates bilaterally.  No uvular deviation   ED Results / Procedures / Treatments   MEDICATIONS ORDERED IN ED: Medications  sodium chloride  0.9 % bolus  1,000 mL (has no administration in time range)  ketorolac (TORADOL) 30 MG/ML injection 30 mg (has no administration in time range)  ondansetron  (ZOFRAN ) injection 4 mg (has no administration in time range)  cephALEXin  (KEFLEX ) capsule 500 mg (has no administration in time range)     IMPRESSION / MDM / ASSESSMENT AND PLAN / ED COURSE  I reviewed the triage vital signs and the nursing notes.  Patient's presentation is most consistent with acute illness / injury with system symptoms.  Patient presents emergency department for fever sore throat body aches chills.  Patient states she took over-the-counter medications without any improvement.  On my examination patient has tonsillar enlargement with erythema and exudates.  She has anterior cervical lymphadenopathy, lack of cough and subjective fever at home meeting Centor criteria.  We will send a strep swab for confirmation.  Patient's respiratory panel was negative.  We will start the patient on Keflex  to cover for strep pharyngitis.  Patient continues to have symptoms we will treat with Toradol Zofran  and fluids while awaiting strep results.  Strep is negative however again as  the patient meets 4 of 4 Centor criteria we will cover with antibiotics.  Patient is feeling better after medications.  Will discharge with Keflex  discussed supportive care at home.  FINAL CLINICAL IMPRESSION(S) / ED DIAGNOSES   Streptococcal pharyngitis  Rx / DC Orders   Keflex   Note:  This document was prepared using Dragon voice recognition software and may include unintentional dictation errors.   Dorothyann Drivers, MD 10/11/23 1910

## 2023-10-11 NOTE — Discharge Instructions (Addendum)
 Please take your antibiotic as prescribed for its entire course.  Please use Tylenol  or ibuprofen  every 6 hours as needed for fever or discomfort.  Over-the-counter benzocaine  products may be helpful for you as well for the sore throat.

## 2023-10-11 NOTE — ED Triage Notes (Signed)
 Pt reports waking up today with n/v, fever and generalized body aches. No relief with dayquil or theraflu.

## 2023-10-12 ENCOUNTER — Emergency Department
Admission: EM | Admit: 2023-10-12 | Discharge: 2023-10-12 | Disposition: A | Discharge status: Home / Self Care | Payer: Self-pay

## 2023-10-12 ENCOUNTER — Other Ambulatory Visit: Payer: Self-pay

## 2023-10-12 DIAGNOSIS — J029 Acute pharyngitis, unspecified: Secondary | ICD-10-CM | POA: Insufficient documentation

## 2023-10-12 DIAGNOSIS — R509 Fever, unspecified: Secondary | ICD-10-CM | POA: Diagnosis present

## 2023-10-12 LAB — POC URINE PREG, ED: Preg Test, Ur: NEGATIVE

## 2023-10-12 LAB — URINALYSIS, COMPLETE (UACMP) WITH MICROSCOPIC
Bilirubin Urine: NEGATIVE
Glucose, UA: NEGATIVE mg/dL
Hgb urine dipstick: NEGATIVE
Ketones, ur: 20 mg/dL — AB
Leukocytes,Ua: NEGATIVE
Nitrite: NEGATIVE
Protein, ur: NEGATIVE mg/dL
Specific Gravity, Urine: 1.018 (ref 1.005–1.030)
pH: 7 (ref 5.0–8.0)

## 2023-10-12 MED ORDER — LIDOCAINE VISCOUS HCL 2 % MT SOLN
15.0000 mL | Freq: Once | OROMUCOSAL | Status: AC
  Administered 2023-10-12: 15 mL via OROMUCOSAL
  Filled 2023-10-12: qty 15

## 2023-10-12 MED ORDER — DEXAMETHASONE 4 MG PO TABS
10.0000 mg | ORAL_TABLET | Freq: Once | ORAL | Status: AC
  Administered 2023-10-12: 10 mg via ORAL
  Filled 2023-10-12: qty 3

## 2023-10-12 MED ORDER — LIDOCAINE VISCOUS HCL 2 % MT SOLN: 15.0000 mL | OROMUCOSAL | 0 refills | Status: AC | PRN

## 2023-10-12 MED ORDER — AMOXICILLIN-POT CLAVULANATE 875-125 MG PO TABS
1.0000 | ORAL_TABLET | Freq: Once | ORAL | Status: AC
  Administered 2023-10-12: 1 via ORAL
  Filled 2023-10-12: qty 1

## 2023-10-12 MED ORDER — ACETAMINOPHEN 325 MG PO TABS
650.0000 mg | ORAL_TABLET | Freq: Once | ORAL | Status: AC | PRN
  Administered 2023-10-12: 650 mg via ORAL
  Filled 2023-10-12: qty 2

## 2023-10-12 MED ORDER — AMOXICILLIN-POT CLAVULANATE 875-125 MG PO TABS: 1.0000 | ORAL_TABLET | Freq: Two times a day (BID) | ORAL | 0 refills | Status: AC

## 2023-10-12 MED ORDER — KETOROLAC TROMETHAMINE 30 MG/ML IJ SOLN
30.0000 mg | Freq: Once | INTRAMUSCULAR | Status: AC
  Administered 2023-10-12: 30 mg via INTRAMUSCULAR
  Filled 2023-10-12: qty 1

## 2023-10-12 MED ORDER — IBUPROFEN 200 MG PO TABS: 600.0000 mg | ORAL_TABLET | Freq: Three times a day (TID) | ORAL | 2 refills | Status: AC | PRN

## 2023-10-12 NOTE — ED Triage Notes (Signed)
 Pt c/o fever & body aches since yesterday morning. Seen here yesterday for the same; diagnosed with strep throat and prescribed antibiotics. States took one dose of abx this morning. Per pt, took 400mg  ibuprofen  at 10am for fever.

## 2023-10-12 NOTE — ED Notes (Signed)
 Pt states headache remains but back pain is improved. Lights dimmed for comfort.

## 2023-10-12 NOTE — ED Provider Notes (Signed)
 [facility] Provider Note    Event Date/Time   First MD Initiated Contact with Patient 10/12/23 1349     (approximate)   History   Fever  Pt c/o fever & body aches since yesterday morning. Seen here yesterday for the same; diagnosed with strep throat and prescribed antibiotics. States took one dose of abx this morning. Per pt, took 400mg  ibuprofen  at 10am for fever.   HPI Briana Robinson is a 23 y.o. female no significant past medical history presents for evaluation of fever, body aches - Patient was seen in our emergency department yesterday after presenting with bodyaches, subjective fever.  Strep negative high Centor score, treated empirically with antibiotics.  COVID/flu/RSV negative.  Rx Keflex .  - Today, patient states she took the prescribed Keflex  this morning but continues to have fever and bodyaches.  Notes moderate headache and some pain in her bilateral lower flanks.  No urinary symptoms.  No abdominal pain.  No cough or congestion.  Does have ongoing sore throat but is able to tolerate p.o. intake without difficulty and no breathing issues. - Fever was refractory to Tylenol  today so she came to emergency department for reevaluation       Physical Exam   Triage Vital Signs: ED Triage Vitals  Encounter Vitals Group     BP 10/12/23 1340 107/70     Girls Systolic BP Percentile --      Girls Diastolic BP Percentile --      Boys Systolic BP Percentile --      Boys Diastolic BP Percentile --      Pulse Rate 10/12/23 1340 (!) 108     Resp 10/12/23 1340 20     Temp 10/12/23 1340 (!) 102.8 F (39.3 C)     Temp Source 10/12/23 1340 Oral     SpO2 10/12/23 1340 98 %     Weight 10/12/23 1341 130 lb (59 kg)     Height 10/12/23 1341 5' 3 (1.6 m)     Head Circumference --      Peak Flow --      Pain Score 10/12/23 1341 10     Pain Loc --      Pain Education --      Exclude from Growth Chart --     Most recent vital signs: Vitals:   10/12/23  1520 10/12/23 1520  BP: 103/74   Pulse: 94 (!) 102  Resp: 16   Temp:  99.1 F (37.3 C)  SpO2: 98%      General: Awake, no distress.  HEENT: Moderate tonsillar swelling bilaterally with erythema bilaterally and exudate on right tonsil.  No uvular deviation.  No submandibular lymphadenopathy or facial swelling.  No stridor, pooling of secretions, trismus.  Able to fully range neck without difficulty, no meningismus. CV:  Good peripheral perfusion. RRR (HR 90s at time of my eval), RP 2+ Resp:  Normal effort. CTAB Abd:  No distention. Nontender to deep palpation throughout, mild bilateral CVA tenderness    ED Results / Procedures / Treatments   Labs (all labs ordered are listed, but only abnormal results are displayed) Labs Reviewed  URINALYSIS, COMPLETE (UACMP) WITH MICROSCOPIC - Abnormal; Notable for the following components:      Result Value   Color, Urine YELLOW (*)    APPearance HAZY (*)    Ketones, ur 20 (*)    Bacteria, UA FEW (*)    All other components within normal limits  POC URINE PREG,  ED     EKG  N/a   RADIOLOGY N/a    PROCEDURES:  Critical Care performed: No  Procedures   MEDICATIONS ORDERED IN ED: Medications  acetaminophen  (TYLENOL ) tablet 650 mg (650 mg Oral Given 10/12/23 1346)  ketorolac (TORADOL) 30 MG/ML injection 30 mg (30 mg Intramuscular Given 10/12/23 1436)  lidocaine  (XYLOCAINE ) 2 % viscous mouth solution 15 mL (15 mLs Mouth/Throat Given 10/12/23 1436)  dexamethasone (DECADRON) tablet 10 mg (10 mg Oral Given 10/12/23 1436)  amoxicillin -clavulanate (AUGMENTIN ) 875-125 MG per tablet 1 tablet (1 tablet Oral Given 10/12/23 1536)     IMPRESSION / MDM / ASSESSMENT AND PLAN / ED COURSE  I reviewed the triage vital signs and the nursing notes.                              DDX/MDM/AP: Differential diagnosis includes, but is not limited to, ongoing pharyngitis with inadequate time for antibiotics to have take effect, consider underlying  viral syndrome other than COVID/flu/RSV given negative testing yesterday.  Exam not consistent with deep neck space infection, peritonsillar abscess.  Consider underlying UTI/pyelonephritis.  Do not suspect acute intra-abdominal pathology.  No clinical concern for meningitis.  Plan: - IM Toradol, Decadron, viscous lidocaine , Tylenol , Motrin  - Discussed possibility of basic screening labs with patient, she prefers to defer which I believe is very reasonable - Urinalysis  Patient's presentation is most consistent with acute complicated illness / injury requiring diagnostic workup.   ED course below.  Patient feeling much better after above location regimen.  Urinalysis unremarkable, no evidence of infection, hCG negative.  Repeat exam reassuring.  Overall suspect pharyngitis with inadequate timeframe of antibiotics.  Will nonetheless escalate to Augmentin , counseled to discontinue Keflex .  Received first dose of Augmentin  in the ED.  Also Rx Motrin  which she was advised to use in addition to Tylenol  for any recurrent pain or fever.  Strict ED return precautions discussed.  Plan for PMD follow-up.  Patient agrees with plan.  Discharged home very well-appearing with stable vital signs, defervesced.  Clinical Course as of 10/12/23 1538  Sat Oct 12, 2023  1509 HCG negative  Urinalysis with no evidence of infection [MM]  1528 Patient reevaluated, feeling much better, headache mildly improved, fever is normalized.  Repeat abdominal exam with no tenderness to deep palpation throughout.  Reassured by negative urinalysis.  Feels much better after medications here.  Again prefers to defer any blood work which I believe is reasonable as patient is very well-appearing I suspect she likely has not had enough time for antibiotics to be effective.  Nonetheless we will escalate antibiotics to Augmentin  and give first dose here in the emergency department.  Counseled to stop Keflex .  Recommend adding Motrin  to her  pain and fever regimen in addition to Tylenol .  Plan for close PMD follow-up.  ED return precautions in place.  Patient agrees with plan. [MM]    Clinical Course User Index [MM] Clarine Ozell LABOR, MD     FINAL CLINICAL IMPRESSION(S) / ED DIAGNOSES   Final diagnoses:  Pharyngitis, unspecified etiology     Rx / DC Orders   ED Discharge Orders          Ordered    amoxicillin -clavulanate (AUGMENTIN ) 875-125 MG tablet  2 times daily        10/12/23 1530    ibuprofen  (MOTRIN  IB) 200 MG tablet  Every 8 hours PRN  10/12/23 1530    lidocaine  (XYLOCAINE ) 2 % solution  Every 4 hours PRN        10/12/23 1530             Note:  This document was prepared using Dragon voice recognition software and may include unintentional dictation errors.   Clarine Ozell LABOR, MD 10/12/23 1538

## 2023-10-12 NOTE — Discharge Instructions (Signed)
 Your evaluation in the emergency department was notable for evidence of an ongoing throat infection.  I have started you on a new antibiotic called Augmentin -please take this as prescribed.  Please stop the Keflex  you were previously prescribed.  Have also prescribed you Motrin  and viscous lidocaine  that you can use as needed for any ongoing fever or sore throat.  Please do follow-up with your primary care provider for reevaluation, and return to the emergency department with any new or worsening symptoms.
